# Patient Record
Sex: Female | Born: 1960 | Race: White | Hispanic: Yes | Marital: Married | Smoking: Never smoker
Health system: Southern US, Community
[De-identification: ages and names within clinical notes are randomized; demographics above are authoritative.]

## PROBLEM LIST (undated history)

## (undated) DIAGNOSIS — K760 Fatty (change of) liver, not elsewhere classified: Secondary | ICD-10-CM

## (undated) DIAGNOSIS — I862 Pelvic varices: Secondary | ICD-10-CM

## (undated) DIAGNOSIS — J40 Bronchitis, not specified as acute or chronic: Secondary | ICD-10-CM

## (undated) DIAGNOSIS — J45909 Unspecified asthma, uncomplicated: Secondary | ICD-10-CM

## (undated) DIAGNOSIS — T7840XA Allergy, unspecified, initial encounter: Secondary | ICD-10-CM

## (undated) HISTORY — DX: Pelvic varices: I86.2

## (undated) HISTORY — DX: Bronchitis, not specified as acute or chronic: J40

## (undated) HISTORY — DX: Fatty (change of) liver, not elsewhere classified: K76.0

## (undated) HISTORY — DX: Allergy, unspecified, initial encounter: T78.40XA

---

## 2003-03-02 ENCOUNTER — Ambulatory Visit (HOSPITAL_COMMUNITY): Admission: RE | Admit: 2003-03-02 | Discharge: 2003-03-02 | Payer: Self-pay | Admitting: Internal Medicine

## 2003-03-02 ENCOUNTER — Encounter: Payer: Self-pay | Admitting: Internal Medicine

## 2005-01-12 ENCOUNTER — Ambulatory Visit: Payer: Self-pay | Admitting: *Deleted

## 2005-01-12 ENCOUNTER — Ambulatory Visit: Payer: Self-pay | Admitting: Family Medicine

## 2005-05-11 ENCOUNTER — Ambulatory Visit: Payer: Self-pay | Admitting: Family Medicine

## 2005-07-25 ENCOUNTER — Ambulatory Visit: Payer: Self-pay | Admitting: Internal Medicine

## 2005-08-02 ENCOUNTER — Ambulatory Visit (HOSPITAL_COMMUNITY): Admission: RE | Admit: 2005-08-02 | Discharge: 2005-08-02 | Payer: Self-pay | Admitting: Family Medicine

## 2005-08-08 ENCOUNTER — Ambulatory Visit: Payer: Self-pay | Admitting: Nurse Practitioner

## 2005-08-15 ENCOUNTER — Encounter: Admission: RE | Admit: 2005-08-15 | Discharge: 2005-08-15 | Payer: Self-pay | Admitting: Internal Medicine

## 2005-08-16 ENCOUNTER — Ambulatory Visit: Payer: Self-pay | Admitting: Family Medicine

## 2005-08-23 ENCOUNTER — Ambulatory Visit: Payer: Self-pay | Admitting: Family Medicine

## 2005-09-21 ENCOUNTER — Ambulatory Visit: Payer: Self-pay | Admitting: Family Medicine

## 2005-09-25 ENCOUNTER — Ambulatory Visit (HOSPITAL_COMMUNITY): Admission: RE | Admit: 2005-09-25 | Discharge: 2005-09-25 | Payer: Self-pay | Admitting: Internal Medicine

## 2005-09-25 ENCOUNTER — Ambulatory Visit: Payer: Self-pay | Admitting: Family Medicine

## 2006-05-31 ENCOUNTER — Ambulatory Visit: Payer: Self-pay | Admitting: Family Medicine

## 2006-11-21 ENCOUNTER — Ambulatory Visit: Payer: Self-pay | Admitting: Family Medicine

## 2007-03-27 ENCOUNTER — Ambulatory Visit: Payer: Self-pay | Admitting: Family Medicine

## 2007-04-22 ENCOUNTER — Ambulatory Visit: Payer: Self-pay | Admitting: Family Medicine

## 2007-05-28 ENCOUNTER — Ambulatory Visit: Payer: Self-pay | Admitting: Family Medicine

## 2007-08-28 ENCOUNTER — Encounter (INDEPENDENT_AMBULATORY_CARE_PROVIDER_SITE_OTHER): Payer: Self-pay | Admitting: *Deleted

## 2007-10-08 ENCOUNTER — Ambulatory Visit (HOSPITAL_COMMUNITY): Admission: RE | Admit: 2007-10-08 | Discharge: 2007-10-08 | Payer: Self-pay | Admitting: Family Medicine

## 2007-10-08 ENCOUNTER — Ambulatory Visit: Payer: Self-pay | Admitting: Internal Medicine

## 2008-01-10 ENCOUNTER — Ambulatory Visit: Payer: Self-pay | Admitting: Internal Medicine

## 2008-01-10 ENCOUNTER — Encounter (INDEPENDENT_AMBULATORY_CARE_PROVIDER_SITE_OTHER): Payer: Self-pay | Admitting: Family Medicine

## 2008-01-10 LAB — CONVERTED CEMR LAB
ALT: 57 units/L — ABNORMAL HIGH (ref 0–35)
AST: 37 units/L (ref 0–37)
Albumin: 4.4 g/dL (ref 3.5–5.2)
Alkaline Phosphatase: 73 units/L (ref 39–117)
BUN: 13 mg/dL (ref 6–23)
Basophils Absolute: 0 10*3/uL (ref 0.0–0.1)
Basophils Relative: 0 % (ref 0–1)
CO2: 22 meq/L (ref 19–32)
Calcium: 9.7 mg/dL (ref 8.4–10.5)
Chloride: 105 meq/L (ref 96–112)
Creatinine, Ser: 0.74 mg/dL (ref 0.40–1.20)
Eosinophils Absolute: 0.2 10*3/uL (ref 0.0–0.7)
Eosinophils Relative: 3 % (ref 0–5)
Glucose, Bld: 92 mg/dL (ref 70–99)
HCT: 42.2 % (ref 36.0–46.0)
Helicobacter Pylori Antibody-IgG: 6.1 — ABNORMAL HIGH
Hemoglobin: 13.7 g/dL (ref 12.0–15.0)
Lymphocytes Relative: 32 % (ref 12–46)
Lymphs Abs: 1.8 10*3/uL (ref 0.7–4.0)
MCHC: 32.5 g/dL (ref 30.0–36.0)
MCV: 90.4 fL (ref 78.0–100.0)
Monocytes Absolute: 0.7 10*3/uL (ref 0.1–1.0)
Monocytes Relative: 12 % (ref 3–12)
Neutro Abs: 3 10*3/uL (ref 1.7–7.7)
Neutrophils Relative %: 53 % (ref 43–77)
Platelets: 178 10*3/uL (ref 150–400)
Potassium: 4.3 meq/L (ref 3.5–5.3)
RBC: 4.67 M/uL (ref 3.87–5.11)
RDW: 13.2 % (ref 11.5–15.5)
Sodium: 138 meq/L (ref 135–145)
TSH: 2.623 microintl units/mL (ref 0.350–5.50)
Total Bilirubin: 0.6 mg/dL (ref 0.3–1.2)
Total Protein: 8.4 g/dL — ABNORMAL HIGH (ref 6.0–8.3)
WBC: 5.6 10*3/uL (ref 4.0–10.5)

## 2008-03-05 ENCOUNTER — Ambulatory Visit: Payer: Self-pay | Admitting: Internal Medicine

## 2008-08-11 ENCOUNTER — Ambulatory Visit: Payer: Self-pay | Admitting: Internal Medicine

## 2008-12-14 ENCOUNTER — Ambulatory Visit: Payer: Self-pay | Admitting: Internal Medicine

## 2009-07-22 ENCOUNTER — Ambulatory Visit: Payer: Self-pay | Admitting: Family Medicine

## 2009-07-22 ENCOUNTER — Encounter (INDEPENDENT_AMBULATORY_CARE_PROVIDER_SITE_OTHER): Payer: Self-pay | Admitting: Adult Health

## 2009-07-22 LAB — CONVERTED CEMR LAB
ALT: 58 units/L — ABNORMAL HIGH (ref 0–35)
AST: 43 units/L — ABNORMAL HIGH (ref 0–37)
Albumin: 4.2 g/dL (ref 3.5–5.2)
Alkaline Phosphatase: 70 units/L (ref 39–117)
BUN: 15 mg/dL (ref 6–23)
Basophils Absolute: 0 10*3/uL (ref 0.0–0.1)
Basophils Relative: 0 % (ref 0–1)
CO2: 24 meq/L (ref 19–32)
Calcium: 9.3 mg/dL (ref 8.4–10.5)
Chloride: 106 meq/L (ref 96–112)
Creatinine, Ser: 0.72 mg/dL (ref 0.40–1.20)
Eosinophils Absolute: 0.3 10*3/uL (ref 0.0–0.7)
Eosinophils Relative: 5 % (ref 0–5)
Glucose, Bld: 79 mg/dL (ref 70–99)
HCT: 37.5 % (ref 36.0–46.0)
Helicobacter Pylori Antibody-IgG: 6.4 — ABNORMAL HIGH
Hemoglobin: 12.3 g/dL (ref 12.0–15.0)
Lymphocytes Relative: 35 % (ref 12–46)
Lymphs Abs: 1.8 10*3/uL (ref 0.7–4.0)
MCHC: 32.8 g/dL (ref 30.0–36.0)
MCV: 88.9 fL (ref 78.0–100.0)
Monocytes Absolute: 0.7 10*3/uL (ref 0.1–1.0)
Monocytes Relative: 13 % — ABNORMAL HIGH (ref 3–12)
Neutro Abs: 2.5 10*3/uL (ref 1.7–7.7)
Neutrophils Relative %: 47 % (ref 43–77)
Platelets: 161 10*3/uL (ref 150–400)
Potassium: 4.1 meq/L (ref 3.5–5.3)
RBC: 4.22 M/uL (ref 3.87–5.11)
RDW: 13 % (ref 11.5–15.5)
Sodium: 138 meq/L (ref 135–145)
TSH: 1.942 microintl units/mL (ref 0.350–4.500)
Total Bilirubin: 0.4 mg/dL (ref 0.3–1.2)
Total Protein: 7.9 g/dL (ref 6.0–8.3)
WBC: 5.3 10*3/uL (ref 4.0–10.5)

## 2009-07-26 ENCOUNTER — Encounter (INDEPENDENT_AMBULATORY_CARE_PROVIDER_SITE_OTHER): Payer: Self-pay | Admitting: Adult Health

## 2009-07-26 LAB — CONVERTED CEMR LAB
HCV Ab: NEGATIVE
Hep A IgM: NEGATIVE
Hep B C IgM: NEGATIVE
Hepatitis B Surface Ag: NEGATIVE

## 2009-07-27 ENCOUNTER — Ambulatory Visit (HOSPITAL_COMMUNITY): Admission: RE | Admit: 2009-07-27 | Discharge: 2009-07-27 | Payer: Self-pay | Admitting: Internal Medicine

## 2009-07-28 ENCOUNTER — Ambulatory Visit: Payer: Self-pay | Admitting: Internal Medicine

## 2009-10-05 ENCOUNTER — Encounter (INDEPENDENT_AMBULATORY_CARE_PROVIDER_SITE_OTHER): Payer: Self-pay | Admitting: Adult Health

## 2009-10-05 ENCOUNTER — Ambulatory Visit: Payer: Self-pay | Admitting: Internal Medicine

## 2009-10-05 LAB — CONVERTED CEMR LAB
ALT: 39 units/L — ABNORMAL HIGH (ref 0–35)
AST: 30 units/L (ref 0–37)
Albumin: 4.3 g/dL (ref 3.5–5.2)
Alkaline Phosphatase: 66 units/L (ref 39–117)
BUN: 16 mg/dL (ref 6–23)
CO2: 21 meq/L (ref 19–32)
Calcium: 8.9 mg/dL (ref 8.4–10.5)
Chlamydia, DNA Probe: NEGATIVE
Chloride: 106 meq/L (ref 96–112)
Creatinine, Ser: 0.6 mg/dL (ref 0.40–1.20)
GC Probe Amp, Genital: NEGATIVE
Glucose, Bld: 109 mg/dL — ABNORMAL HIGH (ref 70–99)
Potassium: 3.5 meq/L (ref 3.5–5.3)
Sodium: 137 meq/L (ref 135–145)
Total Bilirubin: 0.5 mg/dL (ref 0.3–1.2)
Total Protein: 8 g/dL (ref 6.0–8.3)
Vit D, 25-Hydroxy: 17 ng/mL — ABNORMAL LOW (ref 30–89)

## 2009-10-26 ENCOUNTER — Ambulatory Visit: Payer: Self-pay | Admitting: Internal Medicine

## 2009-11-08 ENCOUNTER — Ambulatory Visit: Payer: Self-pay | Admitting: Internal Medicine

## 2011-05-17 ENCOUNTER — Emergency Department (HOSPITAL_COMMUNITY)
Admission: EM | Admit: 2011-05-17 | Discharge: 2011-05-17 | Disposition: A | Discharge status: Home / Self Care | Payer: Worker's Compensation | Attending: Emergency Medicine | Admitting: Emergency Medicine

## 2011-05-17 ENCOUNTER — Emergency Department (HOSPITAL_COMMUNITY): Payer: Worker's Compensation

## 2011-05-17 DIAGNOSIS — Z23 Encounter for immunization: Secondary | ICD-10-CM | POA: Insufficient documentation

## 2011-05-17 DIAGNOSIS — W2203XA Walked into furniture, initial encounter: Secondary | ICD-10-CM | POA: Insufficient documentation

## 2011-05-17 DIAGNOSIS — S0180XA Unspecified open wound of other part of head, initial encounter: Secondary | ICD-10-CM | POA: Insufficient documentation

## 2011-05-17 DIAGNOSIS — Y990 Civilian activity done for income or pay: Secondary | ICD-10-CM | POA: Insufficient documentation

## 2011-05-24 ENCOUNTER — Emergency Department (HOSPITAL_COMMUNITY)
Admission: EM | Admit: 2011-05-24 | Discharge: 2011-05-24 | Disposition: A | Discharge status: Home / Self Care | Payer: Worker's Compensation | Attending: Emergency Medicine | Admitting: Emergency Medicine

## 2011-05-24 DIAGNOSIS — Z4802 Encounter for removal of sutures: Secondary | ICD-10-CM | POA: Insufficient documentation

## 2013-12-02 ENCOUNTER — Ambulatory Visit: Payer: Self-pay

## 2013-12-06 ENCOUNTER — Encounter (HOSPITAL_COMMUNITY): Payer: Self-pay | Admitting: Emergency Medicine

## 2013-12-06 ENCOUNTER — Emergency Department (INDEPENDENT_AMBULATORY_CARE_PROVIDER_SITE_OTHER): Payer: Worker's Compensation

## 2013-12-06 ENCOUNTER — Emergency Department (INDEPENDENT_AMBULATORY_CARE_PROVIDER_SITE_OTHER)
Admission: EM | Admit: 2013-12-06 | Discharge: 2013-12-06 | Disposition: A | Discharge status: Home / Self Care | Payer: Worker's Compensation | Source: Home / Self Care

## 2013-12-06 DIAGNOSIS — R0982 Postnasal drip: Secondary | ICD-10-CM

## 2013-12-06 DIAGNOSIS — R05 Cough: Secondary | ICD-10-CM

## 2013-12-06 LAB — POCT RAPID STREP A: Streptococcus, Group A Screen (Direct): NEGATIVE

## 2013-12-06 MED ORDER — ALBUTEROL SULFATE HFA 108 (90 BASE) MCG/ACT IN AERS: 2.0000 | INHALATION_SPRAY | Freq: Four times a day (QID) | RESPIRATORY_TRACT | Status: DC | PRN

## 2013-12-06 MED ORDER — HYDROCOD POLST-CHLORPHEN POLST 10-8 MG/5ML PO LQCR: ORAL | Status: DC

## 2013-12-06 NOTE — ED Provider Notes (Signed)
Medical screening examination/treatment/procedure(s) were performed by non-physician practitioner and as supervising physician I was immediately available for consultation/collaboration.  Caliope Ruppert, M.D.  Lakethia Coppess C Evony Rezek, MD 12/06/13 2310 

## 2013-12-06 NOTE — ED Provider Notes (Signed)
CSN: [id number]     Arrival date & time 12/06/13  1609 History   First MD Initiated Contact with Patient 12/06/13 1824     Chief Complaint  Patient presents with  . Cough   (Consider location/radiation/quality/duration/timing/severity/associated sxs/prior Treatment) HPI Comments: 52 year old Hispanic female presents with a cough for one month. Symptoms started out as upper respiratory congestion and fever. Some of the symptoms improved however she has a continuous dry tacky cough. This is associated with chest soreness and pain upon coughing. The greatest is in the bilateral ribs.   History reviewed. No pertinent past medical history. History reviewed. No pertinent past surgical history. History reviewed. No pertinent family history. History  Substance Use Topics  . Smoking status: Never Smoker   . Smokeless tobacco: Not on file  . Alcohol Use: No   OB History   Grav Para Term Preterm Abortions TAB SAB Ect Mult Living                 Review of Systems  Constitutional: Positive for fever and activity change. Negative for chills, appetite change and fatigue.  HENT: Positive for congestion, postnasal drip, rhinorrhea and sore throat. Negative for ear pain and facial swelling.   Eyes: Negative.   Respiratory: Positive for cough. Negative for chest tightness and wheezing.   Cardiovascular: Negative.  Negative for leg swelling.  Gastrointestinal: Negative.   Musculoskeletal: Negative for neck pain and neck stiffness.  Skin: Negative for pallor and rash.  Neurological: Negative.     Allergies  Review of patient's allergies indicates no known allergies.  Home Medications   Current Outpatient Rx  Name  Route  Sig  Dispense  Refill  . albuterol (PROVENTIL HFA;VENTOLIN HFA) 108 (90 BASE) MCG/ACT inhaler   Inhalation   Inhale 2 puffs into the lungs every 6 (six) hours as needed for wheezing or shortness of breath.   1 Inhaler   0   . chlorpheniramine-HYDROcodone (TUSSIONEX  PENNKINETIC ER) 10-8 MG/5ML LQCR      Take 2.5 ml to 5 ml every 12 hours prn cough. Will cause drowsiness.   60 mL   0    BP 125/86  Pulse 70  Temp(Src) 98.3 F (36.8 C) (Oral)  Resp 17  SpO2 98% Physical Exam  Nursing note and vitals reviewed. Constitutional: She is oriented to person, place, and time. She appears well-developed and well-nourished. No distress.  HENT:  Bilateral TMs are normal Oropharynx with bilaterally enlarged, cryptic palatine tonsils. Oropharynx is erythematous but without exudates. Positive for clear PND.  Eyes: Conjunctivae and EOM are normal.  Neck: Normal range of motion. Neck supple.  Cardiovascular: Normal rate, regular rhythm and normal heart sounds.   Pulmonary/Chest: Effort normal and breath sounds normal. No respiratory distress. She has no wheezes. She has no rales. She exhibits tenderness.  Musculoskeletal: Normal range of motion. She exhibits no edema.  Lymphadenopathy:    She has no cervical adenopathy.  Neurological: She is alert and oriented to person, place, and time.  Skin: Skin is warm and dry. No rash noted.  Psychiatric: She has a normal mood and affect.    ED Course  Procedures (including critical care time) Labs Review Labs Reviewed  POCT RAPID STREP A ([facility] CARE ONLY)   Imaging Review Dg Chest 2 View  12/06/2013   CLINICAL DATA:  Cough and congestion for 1 month.  EXAM: CHEST  2 VIEW  COMPARISON:  None.  FINDINGS: Cardiac silhouette is normal in size. The aorta is  mildly uncoiled. No mediastinal or hilar masses. Clear lungs. No pleural effusion or pneumothorax. The bony thorax is intact.  IMPRESSION: No active cardiopulmonary disease.   Electronically Signed   By: Amie Portland M.D.   On: 12/06/2013 19:25      MDM   1. Cough   2. PND (post-nasal drip)      The chest x-ray reveals no etiology for the cough. I suspect the cough is due to occult bronchospasm with contribution from PND. Tussionex 1/2-1 teaspoon every 12  hours when necessary cough and drainage Albuterol HFA 2 puffs q. 4-6 hours when necessary cough Followup with PCP as soon as possible recommend that she start locating one as soon as possible.  Hayden Rasmussen, NP 12/06/13 2021

## 2013-12-06 NOTE — ED Notes (Signed)
Pt  Has  A  Non  Productive   Cough      X   1  Month   -  Pt  Reports  Pain  In  Sides  And  Shoulders  As  Well         She  Reports  A  headachhe  As  Well  -  The  Pt  Ambulated  To  Room    Her  Skin is  Warm  And  Dry        Family  There  To interpret

## 2013-12-09 LAB — CULTURE, GROUP A STREP

## 2013-12-16 ENCOUNTER — Telehealth: Payer: Self-pay

## 2013-12-16 ENCOUNTER — Encounter: Payer: Self-pay | Admitting: Internal Medicine

## 2013-12-16 ENCOUNTER — Ambulatory Visit: Payer: Worker's Compensation | Attending: Internal Medicine | Admitting: Internal Medicine

## 2013-12-16 VITALS — BP 129/87 | HR 71 | Temp 97.9°F | Resp 16 | Ht 64.0 in | Wt 184.0 lb

## 2013-12-16 DIAGNOSIS — R059 Cough, unspecified: Secondary | ICD-10-CM

## 2013-12-16 DIAGNOSIS — R05 Cough: Secondary | ICD-10-CM | POA: Insufficient documentation

## 2013-12-16 DIAGNOSIS — Z139 Encounter for screening, unspecified: Secondary | ICD-10-CM

## 2013-12-16 DIAGNOSIS — J329 Chronic sinusitis, unspecified: Secondary | ICD-10-CM

## 2013-12-16 DIAGNOSIS — R0981 Nasal congestion: Secondary | ICD-10-CM

## 2013-12-16 DIAGNOSIS — J3489 Other specified disorders of nose and nasal sinuses: Secondary | ICD-10-CM

## 2013-12-16 LAB — COMPLETE METABOLIC PANEL WITH GFR
ALT: 33 U/L (ref 0–35)
AST: 29 U/L (ref 0–37)
Albumin: 4.3 g/dL (ref 3.5–5.2)
Alkaline Phosphatase: 86 U/L (ref 39–117)
BUN: 12 mg/dL (ref 6–23)
CO2: 28 mEq/L (ref 19–32)
Calcium: 9.4 mg/dL (ref 8.4–10.5)
Chloride: 102 mEq/L (ref 96–112)
Creat: 0.6 mg/dL (ref 0.50–1.10)
GFR, Est African American: >89 mL/min
GFR, Est Non African American: >89 mL/min
Glucose, Bld: 85 mg/dL (ref 70–99)
Potassium: 4 mEq/L (ref 3.5–5.3)
Sodium: 140 mEq/L (ref 135–145)
Total Bilirubin: 0.3 mg/dL (ref 0.3–1.2)
Total Protein: 8.2 g/dL (ref 6.0–8.3)

## 2013-12-16 LAB — LIPID PANEL
Cholesterol: 156 mg/dL (ref 0–200)
HDL: 37 mg/dL — ABNORMAL LOW (ref >39)
LDL Cholesterol: 88 mg/dL (ref 0–99)
Total CHOL/HDL Ratio: 4.2 Ratio
Triglycerides: 156 mg/dL — ABNORMAL HIGH (ref <150)
VLDL: 31 mg/dL (ref 0–40)

## 2013-12-16 LAB — CBC WITH DIFFERENTIAL/PLATELET
Basophils Absolute: 0 10*3/uL (ref 0.0–0.1)
Basophils Relative: 1 % (ref 0–1)
Eosinophils Absolute: 0.3 10*3/uL (ref 0.0–0.7)
Eosinophils Relative: 6 % — ABNORMAL HIGH (ref 0–5)
HCT: 39.1 % (ref 36.0–46.0)
Hemoglobin: 13.5 g/dL (ref 12.0–15.0)
Lymphocytes Relative: 36 % (ref 12–46)
Lymphs Abs: 1.8 10*3/uL (ref 0.7–4.0)
MCH: 30.8 pg (ref 26.0–34.0)
MCHC: 34.5 g/dL (ref 30.0–36.0)
MCV: 89.3 fL (ref 78.0–100.0)
Monocytes Absolute: 0.5 10*3/uL (ref 0.1–1.0)
Monocytes Relative: 11 % (ref 3–12)
Neutro Abs: 2.4 10*3/uL (ref 1.7–7.7)
Neutrophils Relative %: 46 % (ref 43–77)
Platelets: 194 10*3/uL (ref 150–400)
RBC: 4.38 MIL/uL (ref 3.87–5.11)
RDW: 13.8 % (ref 11.5–15.5)
WBC: 5.1 10*3/uL (ref 4.0–10.5)

## 2013-12-16 MED ORDER — SULFAMETHOXAZOLE-TMP DS 800-160 MG PO TABS: 1.0000 | ORAL_TABLET | Freq: Two times a day (BID) | ORAL | Status: DC

## 2013-12-16 MED ORDER — FLUTICASONE PROPIONATE 50 MCG/ACT NA SUSP: 2.0000 | Freq: Every day | NASAL | Status: DC

## 2013-12-16 MED ORDER — BENZONATATE 100 MG PO CAPS: 100.0000 mg | ORAL_CAPSULE | Freq: Three times a day (TID) | ORAL | Status: DC | PRN

## 2013-12-16 NOTE — Progress Notes (Signed)
Patient Demographics  Melanie Durham, is a 53 y.o. female  HXT:[id number]  [id number]:[id number]  DOB - Apr 26, 1961  CC:  Chief Complaint  Patient presents with  . Establish Care  . Bronchitis       HPI: Melanie Durham is a 53 y.o. female here today to establish medical care. She was recently seen in urgent care with symptoms of URI ongoing for the last 3-4 weeks, EMR reviewed had a chest x-ray done which was negative, patient was prescribed albuterol and cough medication, as per patient symptoms are not improving she still has nasal congestion postnasal drip minimal sore throat productive cough, she does not smoke cigarettes Patient has No headache, No chest pain, No abdominal pain - No Nausea, No new weakness tingling or numbness, Allergies  Allergen Reactions  . Penicillins    Past Medical History  Diagnosis Date  . Bronchitis    Current Outpatient Prescriptions on File Prior to Visit  Medication Sig Dispense Refill  . chlorpheniramine-HYDROcodone (TUSSIONEX PENNKINETIC ER) 10-8 MG/5ML LQCR Take 2.5 ml to 5 ml every 12 hours prn cough. Will cause drowsiness.  60 mL  0  . albuterol (PROVENTIL HFA;VENTOLIN HFA) 108 (90 BASE) MCG/ACT inhaler Inhale 2 puffs into the lungs every 6 (six) hours as needed for wheezing or shortness of breath.  1 Inhaler  0   No current facility-administered medications on file prior to visit.   Family History  Problem Relation Age of Onset  . Diabetes Father   . Heart disease Maternal Grandmother    History   Social History  . Marital Status: Married    Spouse Name: N/A    Number of Children: N/A  . Years of Education: N/A   Occupational History  . Not on file.   Social History Main Topics  . Smoking status: Never Smoker   . Smokeless tobacco: Not on file  . Alcohol Use: No  . Drug Use: No  . Sexual Activity: Not on file   Other Topics Concern  . Not on file   Social History Narrative  . No narrative on file    Review of  Systems: Constitutional: Negative for fever, chills, diaphoresis, activity change, appetite change and fatigue. HENT: Negative for ear pain, nosebleeds, congestion, facial swelling, rhinorrhea, neck pain, neck stiffness and ear discharge.  Eyes: Negative for pain, discharge, redness, itching and visual disturbance. Respiratory: Positive for cough,  shortness of breath, negative wheezing and stridor.  Cardiovascular: Negative for chest pain, palpitations and leg swelling. Gastrointestinal: Negative for abdominal distention. Genitourinary: Negative for dysuria, urgency, frequency, hematuria, flank pain, decreased urine volume, difficulty urinating and dyspareunia.  Musculoskeletal: Negative for back pain, joint swelling, arthralgia and gait problem. Neurological: Negative for dizziness, tremors, seizures, syncope, facial asymmetry, speech difficulty, weakness, light-headedness, numbness and headaches.  Hematological: Negative for adenopathy. Does not bruise/bleed easily. Psychiatric/Behavioral: Negative for hallucinations, behavioral problems, confusion, dysphoric mood, decreased concentration and agitation.    Objective:   Filed Vitals:   12/16/13 1533  BP: 129/87  Pulse: 71  Temp: 97.9 F (36.6 C)  Resp: 16    Physical Exam: Constitutional: Patient appears well-developed and has intermittent cough HENT: Sinus tenderness nasal congestion Oropharynx is clear and moist. Enlarged tonsils no exudate Eyes: Conjunctivae and EOM are normal. PERRLA, no scleral icterus. Neck: Normal ROM. Neck supple. No JVD. Positive cervical lymphadenopathy CVS: RRR, S1/S2 +, no murmurs, no gallops, no carotid bruit.  Pulmonary: Effort and breath sounds normal, no stridor, rhonchi, wheezes, rales.  Abdominal: Soft. BS +,  no distension, tenderness, rebound or guarding.  Musculoskeletal: Normal range of motion. No edema and no tenderness.  Neuro: Alert. Normal reflexes, muscle tone coordination. No cranial  nerve deficit. Skin: Skin is warm and dry. No rash noted. Not diaphoretic. No erythema. No pallor. Psychiatric: Normal mood and affect. Behavior, judgment, thought content normal.  Lab Results  Component Value Date   WBC 5.3 07/22/2009   HGB 12.3 07/22/2009   HCT 37.5 07/22/2009   MCV 88.9 07/22/2009   PLT 161 07/22/2009   Lab Results  Component Value Date   CREATININE 0.60 10/05/2009   BUN 16 10/05/2009   NA 137 10/05/2009   K 3.5 10/05/2009   CL 106 10/05/2009   CO2 21 10/05/2009    No results found for this basename: HGBA1C   Lipid Panel  No results found for this basename: chol, trig, hdl, cholhdl, vldl, ldlcalc       Assessment and plan:   1. Sinusitis  - sulfamethoxazole-trimethoprim (BACTRIM DS) 800-160 MG per tablet; Take 1 tablet by mouth 2 (two) times daily.  Dispense: 20 tablet; Refill: 0  2. Stuffy nose  - fluticasone (FLONASE) 50 MCG/ACT nasal spray; Place 2 sprays into both nostrils daily.  Dispense: 16 g; Refill: 1  3. Cough  - benzonatate (TESSALON) 100 MG capsule; Take 1 capsule (100 mg total) by mouth 3 (three) times daily as needed for cough.  Dispense: 30 capsule; Refill: 1  4. Screening Do baseline blood work - CBC with Differential - COMPLETE METABOLIC PANEL WITH GFR - TSH - Lipid panel - Vit D  25 hydroxy (rtn osteoporosis monitoring)  Advised patient for saltwater gargles  Return in about 6 weeks (around 01/27/2014), or if symptoms worsen or fail to improve.    The patient was given clear instructions to go to ER or return to medical center if symptoms don't improve, worsen or new problems develop. The patient verbalized understanding. The patient was told to call to get lab results if they haven't heard anything in the next week.     Lorayne Marek, MD

## 2013-12-16 NOTE — Progress Notes (Signed)
Pt here to establish care C/o frequent cough with yellow phlegm s/p flu virus Taking prescribed Cetirizine and Hydrocodone/Chlorphen ER with some relief Hx Bronchitis-nonsmoker Afebrile. Denies n/v Spanish interpretor present

## 2013-12-17 ENCOUNTER — Ambulatory Visit: Payer: Worker's Compensation | Attending: Internal Medicine

## 2013-12-17 ENCOUNTER — Telehealth: Payer: Self-pay

## 2013-12-17 LAB — VITAMIN D 25 HYDROXY (VIT D DEFICIENCY, FRACTURES): Vit D, 25-Hydroxy: 21 ng/mL — ABNORMAL LOW (ref 30–89)

## 2013-12-17 LAB — TSH: TSH: 2.783 u[IU]/mL (ref 0.350–4.500)

## 2013-12-17 MED ORDER — VITAMIN D (ERGOCALCIFEROL) 1.25 MG (50000 UNIT) PO CAPS: 50000.0000 [IU] | ORAL_CAPSULE | ORAL | Status: DC

## 2013-12-17 NOTE — Telephone Encounter (Signed)
Message copied by Dorothe Pea on Wed Dec 17, 2013 11:23 AM ------      Message from: Lorayne Marek      Created: Wed Dec 17, 2013 10:12 AM       Blood work reviewed, noticed low vitamin D, call patient advise to start ergocalciferol 50,000 units once a week for the duration of  12 weeks.       ------

## 2013-12-17 NOTE — Telephone Encounter (Signed)
Interpreter line used Patient is aware of her labs Prescription sent to our pharmacy

## 2014-01-27 ENCOUNTER — Ambulatory Visit: Payer: Self-pay

## 2014-02-09 ENCOUNTER — Telehealth: Payer: Self-pay | Admitting: Internal Medicine

## 2014-02-09 NOTE — Telephone Encounter (Signed)
LVM to reschedule appt

## 2014-03-04 ENCOUNTER — Encounter: Payer: Self-pay | Admitting: Internal Medicine

## 2014-04-14 ENCOUNTER — Telehealth: Payer: Self-pay | Admitting: Emergency Medicine

## 2014-04-14 ENCOUNTER — Ambulatory Visit: Payer: No Typology Code available for payment source | Attending: Internal Medicine | Admitting: Internal Medicine

## 2014-04-14 ENCOUNTER — Encounter: Payer: Self-pay | Admitting: Internal Medicine

## 2014-04-14 VITALS — BP 118/79 | HR 66 | Temp 98.6°F | Resp 16 | Ht 66.0 in | Wt 183.0 lb

## 2014-04-14 DIAGNOSIS — B351 Tinea unguium: Secondary | ICD-10-CM

## 2014-04-14 LAB — COMPREHENSIVE METABOLIC PANEL
ALT: 37 U/L — ABNORMAL HIGH (ref 0–35)
AST: 26 U/L (ref 0–37)
Albumin: 4.3 g/dL (ref 3.5–5.2)
Alkaline Phosphatase: 82 U/L (ref 39–117)
BUN: 11 mg/dL (ref 6–23)
CO2: 28 mEq/L (ref 19–32)
Calcium: 9.7 mg/dL (ref 8.4–10.5)
Chloride: 102 mEq/L (ref 96–112)
Creat: 0.61 mg/dL (ref 0.50–1.10)
Glucose, Bld: 92 mg/dL (ref 70–99)
Potassium: 4.2 mEq/L (ref 3.5–5.3)
Sodium: 136 mEq/L (ref 135–145)
Total Bilirubin: 0.6 mg/dL (ref 0.2–1.2)
Total Protein: 8.3 g/dL (ref 6.0–8.3)

## 2014-04-14 MED ORDER — TERBINAFINE HCL 250 MG PO TABS: 250.0000 mg | ORAL_TABLET | Freq: Every day | ORAL | Status: DC

## 2014-04-14 NOTE — Telephone Encounter (Signed)
Attempted to reach pt regarding medication clarification. Left message per language line to not start Lamisil medication until lab results are back in 2 dys

## 2014-04-14 NOTE — Progress Notes (Unsigned)
Pt here with c/o itchiness around mole of right upper buttock x 1 month. Pt denies redness or swelling or pain.  Spanish interpretor present for communication

## 2014-04-14 NOTE — Progress Notes (Unsigned)
Patient ID: Melanie Durham, female   DOB: 01/24/1961, 53 y.o.   MRN: [id number]   HPI: Melanie Durham is a 53 y.o. female presenting on 04/14/2014 who comes in for evaluation for an itchy mole on her left buttock and for treatment of toe and fingernail fungus.     Past Medical History  Diagnosis Date  . Bronchitis     History reviewed. No pertinent past surgical history.  Current Outpatient Prescriptions  Medication Sig Dispense Refill  . albuterol (PROVENTIL HFA;VENTOLIN HFA) 108 (90 BASE) MCG/ACT inhaler Inhale 2 puffs into the lungs every 6 (six) hours as needed for wheezing or shortness of breath.  1 Inhaler  0  . benzonatate (TESSALON) 100 MG capsule Take 1 capsule (100 mg total) by mouth 3 (three) times daily as needed for cough.  30 capsule  1  . chlorpheniramine-HYDROcodone (TUSSIONEX PENNKINETIC ER) 10-8 MG/5ML LQCR Take 2.5 ml to 5 ml every 12 hours prn cough. Will cause drowsiness.  60 mL  0  . fluticasone (FLONASE) 50 MCG/ACT nasal spray Place 2 sprays into both nostrils daily.  16 g  1  . terbinafine (LAMISIL) 250 MG tablet Take 1 tablet (250 mg total) by mouth daily.  30 tablet  3  . Vitamin D, Ergocalciferol, (DRISDOL) 50000 UNITS CAPS capsule Take 1 capsule (50,000 Units total) by mouth every 7 (seven) days.  12 capsule  0   No current facility-administered medications for this visit.    Allergies  Allergen Reactions  . Penicillins     Family History  Problem Relation Age of Onset  . Diabetes Father   . Heart disease Maternal Grandmother     History   Social History  . Marital Status: Married    Spouse Name: N/A    Number of Children: N/A  . Years of Education: N/A   Occupational History  . Not on file.   Social History Main Topics  . Smoking status: Never Smoker   . Smokeless tobacco: Not on file  . Alcohol Use: No  . Drug Use: No  . Sexual Activity: Not on file   Other Topics Concern  . Not on file   Social History Narrative  . No  narrative on file    Review of Systems  Review of Systems  Constitutional: Negative for fever, chills, diaphoresis, activity change, appetite change and fatigue.  HENT: Negative for ear pain, nosebleeds, congestion, facial swelling, rhinorrhea, neck pain, neck stiffness and ear discharge.  Eyes: Negative for pain, discharge, redness, itching and visual disturbance.  Respiratory: Negative for cough, choking, chest tightness, shortness of breath, wheezing and stridor.  Cardiovascular: Negative for chest pain, palpitations and leg swelling.  Gastrointestinal: Negative for abdominal distention, vomiting, diarrhea or consitpation Genitourinary: Negative for dysuria, urgency, frequency, hematuria, flank pain, decreased urine volume, difficulty urinating and dyspareunia.  Musculoskeletal: Negative for back pain, joint swelling, arthralgias or gait problem.  Neurological: Negative for dizziness, tremors, seizures, syncope, facial asymmetry, speech difficulty, weakness, light-headedness, numbness and headaches.  Hematological: Negative for adenopathy. Does not bruise/bleed easily.  Psychiatric/Behavioral: Negative for hallucinations, behavioral problems, confusion, dysphoric mood    Objective:  BP 118/79  Pulse 66  Temp(Src) 98.6 F (37 C) (Oral)  Resp 16  Ht 5\' 6"  (1.676 m)  Wt 183 lb (83.008 kg)  BMI 29.55 kg/m2  SpO2 97% Filed Weights   04/14/14 0955  Weight: 183 lb (83.008 kg)     Physical Exam  Constitutional: Appears well-developed and well-nourished. No distress. HENT: Normocephalic.  External right and left ear normal. Oropharynx is clear and moist.  Eyes: Conjunctivae and EOM are normal. PERRLA, no scleral icterus.  Neck: Normal ROM. Neck supple. No JVD. No tracheal deviation. No thyromegaly.  CVS: RRR, S1/S2 +, no murmurs, no gallops, no carotid bruit.  Pulmonary: Effort and breath sounds normal, no stridor, rhonchi, wheezes, rales.  Abdominal: Soft. BS +,  no distension,  tenderness, rebound or guarding.  Musculoskeletal: Normal range of motion. No edema and no tenderness.  Neuro: Alert. Normal reflexes, muscle tone coordination. No cranial nerve deficit. Nails: fungal infection more prominent on right foot 1st, 4th and 5th toes and on hands (thumbs) Skin: small black mole about 1 cm in diameter on left upper buttock with no signs of discoloration or inflammation  Psychiatric: Normal mood and affect. Behavior, judgment, thought content normal.   Lab Results  Component Value Date   WBC 5.1 12/16/2013   HGB 13.5 12/16/2013   HCT 39.1 12/16/2013   MCV 89.3 12/16/2013   PLT 194 12/16/2013   Lab Results  Component Value Date   CREATININE 0.60 12/16/2013   BUN 12 12/16/2013   NA 140 12/16/2013   K 4.0 12/16/2013   CL 102 12/16/2013   CO2 28 12/16/2013    No results found for this basename: HGBA1C   Lipid Panel     Component Value Date/Time   CHOL 156 12/16/2013 1550   TRIG 156* 12/16/2013 1550   HDL 37* 12/16/2013 1550   CHOLHDL 4.2 12/16/2013 1550   VLDL 31 12/16/2013 1550   LDLCALC 88 12/16/2013 1550        Patient Active Problem List   Diagnosis Date Noted  . Sinusitis 12/16/2013  . Stuffy nose 12/16/2013     Preventative Medicine:  Health Maintenance  Topic Date Due  . Pap Smear  01/31/1981  . Tetanus/tdap  01/31/1982  . Mammogram  01/31/2013  . Colonoscopy  01/31/2013  . Influenza Vaccine  07/11/2014    Adult vaccines due  Topic Date Due  . Tetanus/tdap  01/31/1982   Mammogram/Pap Smear : Colonoscopy : Flu vaccine:   LAB WORK:  Metabolic panel: CBC:  Vitamin D : Lipid Panel: TSH: PSA:    Assessment and plan: Nail fungal infection - Plan: Comprehensive metabolic panel - if LFTs normal, pt can fill and start Terbinafine for a 12 wk course - repeat LFTs on a monthly basis  Mole Referral to Derm for removal per patient's request.    Return in about 1 month (around 05/15/2014), or if symptoms worsen or fail to improve.   The patient was  given clear instructions to go to ER or return to medical center if symptoms don't improve, worsen or new problems develop. The patient verbalized understanding. The patient was told to call to get lab results if they haven't heard anything in the next week.     Debbe Odea, MD

## 2014-05-15 ENCOUNTER — Encounter: Payer: Self-pay | Admitting: Internal Medicine

## 2014-05-15 ENCOUNTER — Ambulatory Visit: Payer: No Typology Code available for payment source | Attending: Internal Medicine | Admitting: Internal Medicine

## 2014-05-15 VITALS — BP 130/81 | HR 78 | Temp 98.2°F | Resp 16 | Wt 185.8 lb

## 2014-05-15 DIAGNOSIS — L299 Pruritus, unspecified: Secondary | ICD-10-CM

## 2014-05-15 DIAGNOSIS — D235 Other benign neoplasm of skin of trunk: Secondary | ICD-10-CM | POA: Insufficient documentation

## 2014-05-15 DIAGNOSIS — D239 Other benign neoplasm of skin, unspecified: Secondary | ICD-10-CM

## 2014-05-15 DIAGNOSIS — D229 Melanocytic nevi, unspecified: Secondary | ICD-10-CM

## 2014-05-15 DIAGNOSIS — B351 Tinea unguium: Secondary | ICD-10-CM | POA: Insufficient documentation

## 2014-05-15 MED ORDER — CETIRIZINE HCL 10 MG PO TABS: 10.0000 mg | ORAL_TABLET | Freq: Every day | ORAL | Status: DC

## 2014-05-15 NOTE — Progress Notes (Signed)
MRN: [id number] Name: Melanie Durham  Sex: female Age: 53 y.o. DOB: June 27, 1961  Allergies: Penicillins  Chief Complaint  Patient presents with  . Nevus    HPI: Patient is 53 y.o. female who Comes today reported to have the mole on her left buttock area since birth but she reported to increase in size and possible change in color recently also patient complaining of some itching around the area denies any fever chills any discharge, she also has history of onychomycosis and her was started on Lamisil.  Past Medical History  Diagnosis Date  . Bronchitis     History reviewed. No pertinent past surgical history.    Medication List       This list is accurate as of: 05/15/14  5:16 PM.  Always use your most recent med list.               albuterol 108 (90 BASE) MCG/ACT inhaler  Commonly known as:  PROVENTIL HFA;VENTOLIN HFA  Inhale 2 puffs into the lungs every 6 (six) hours as needed for wheezing or shortness of breath.     benzonatate 100 MG capsule  Commonly known as:  TESSALON  Take 1 capsule (100 mg total) by mouth 3 (three) times daily as needed for cough.     cetirizine 10 MG tablet  Commonly known as:  ZYRTEC  Take 1 tablet (10 mg total) by mouth daily.     chlorpheniramine-HYDROcodone 10-8 MG/5ML Lqcr  Commonly known as:  TUSSIONEX PENNKINETIC ER  Take 2.5 ml to 5 ml every 12 hours prn cough. Will cause drowsiness.     fluticasone 50 MCG/ACT nasal spray  Commonly known as:  FLONASE  Place 2 sprays into both nostrils daily.     terbinafine 250 MG tablet  Commonly known as:  LAMISIL  Take 1 tablet (250 mg total) by mouth daily.     Vitamin D (Ergocalciferol) 50000 UNITS Caps capsule  Commonly known as:  DRISDOL  Take 1 capsule (50,000 Units total) by mouth every 7 (seven) days.        Meds ordered this encounter  Medications  . cetirizine (ZYRTEC) 10 MG tablet    Sig: Take 1 tablet (10 mg total) by mouth daily.    Dispense:  30 tablet   Refill:  3     There is no immunization history on file for this patient.  Family History  Problem Relation Age of Onset  . Diabetes Father   . Heart disease Maternal Grandmother     History  Substance Use Topics  . Smoking status: Never Smoker   . Smokeless tobacco: Not on file  . Alcohol Use: No    Review of Systems   As noted in HPI  Filed Vitals:   05/15/14 1649  BP: 130/81  Pulse: 78  Temp: 98.2 F (36.8 C)  Resp: 16    Physical Exam  Physical Exam  Eyes: EOM are normal. Pupils are equal, round, and reactive to light.  Cardiovascular: Normal rate and regular rhythm.   Pulmonary/Chest: Breath sounds normal. No respiratory distress. She has no wheezes. She has no rales.  Skin:  Patient examined in presence of medical staff as chaperon Left buttock  1 to 2 cm raised nevus, non tender no discharge , no sign of infection.    CBC    Component Value Date/Time   WBC 5.1 12/16/2013 1550   RBC 4.38 12/16/2013 1550   HGB 13.5 12/16/2013 1550   HCT 39.1  12/16/2013 1550   PLT 194 12/16/2013 1550   MCV 89.3 12/16/2013 1550   LYMPHSABS 1.8 12/16/2013 1550   MONOABS 0.5 12/16/2013 1550   EOSABS 0.3 12/16/2013 1550   BASOSABS 0.0 12/16/2013 1550    CMP     Component Value Date/Time   NA 136 04/14/2014 1010   K 4.2 04/14/2014 1010   CL 102 04/14/2014 1010   CO2 28 04/14/2014 1010   GLUCOSE 92 04/14/2014 1010   BUN 11 04/14/2014 1010   CREATININE 0.61 04/14/2014 1010   CREATININE 0.60 10/05/2009 2124   CALCIUM 9.7 04/14/2014 1010   PROT 8.3 04/14/2014 1010   ALBUMIN 4.3 04/14/2014 1010   AST 26 04/14/2014 1010   ALT 37* 04/14/2014 1010   ALKPHOS 82 04/14/2014 1010   BILITOT 0.6 04/14/2014 1010   GFRNONAA >89 12/16/2013 1550   GFRAA >89 12/16/2013 1550    Lab Results  Component Value Date/Time   CHOL 156 12/16/2013  3:50 PM    No components found with this basename: hga1c    Lab Results  Component Value Date/Time   AST 26 04/14/2014 10:10 AM    Assessment and Plan  Nevus - Plan: Has been  possible change in size and color, I have referred her to dermatology  Ambulatory referral to Dermatology  Itching - Plan: cetirizine (ZYRTEC) 10 MG tablet when necessary for itching   Return in about 3 months (around 08/15/2014) for toenail fungus.  Lorayne Marek, MD

## 2014-05-15 NOTE — Progress Notes (Signed)
Patient here for mole to her right buttocks States the area around the mole is very itchy

## 2014-05-18 DIAGNOSIS — D229 Melanocytic nevi, unspecified: Secondary | ICD-10-CM | POA: Insufficient documentation

## 2014-06-17 ENCOUNTER — Encounter: Payer: Self-pay | Admitting: Internal Medicine

## 2014-06-17 ENCOUNTER — Ambulatory Visit: Payer: Worker's Compensation | Attending: Internal Medicine | Admitting: Internal Medicine

## 2014-06-17 VITALS — BP 129/81 | HR 60 | Temp 97.9°F | Resp 14 | Ht 66.0 in | Wt 188.0 lb

## 2014-06-17 DIAGNOSIS — R51 Headache: Secondary | ICD-10-CM

## 2014-06-17 DIAGNOSIS — K219 Gastro-esophageal reflux disease without esophagitis: Secondary | ICD-10-CM | POA: Insufficient documentation

## 2014-06-17 DIAGNOSIS — R1013 Epigastric pain: Secondary | ICD-10-CM

## 2014-06-17 LAB — CBC WITH DIFFERENTIAL/PLATELET
Basophils Absolute: 0 10*3/uL (ref 0.0–0.1)
Basophils Relative: 1 % (ref 0–1)
Eosinophils Absolute: 0.1 10*3/uL (ref 0.0–0.7)
Eosinophils Relative: 3 % (ref 0–5)
HCT: 39.6 % (ref 36.0–46.0)
Hemoglobin: 14 g/dL (ref 12.0–15.0)
LYMPHS ABS: 1.6 10*3/uL (ref 0.7–4.0)
Lymphocytes Relative: 39 % (ref 12–46)
MCH: 30.9 pg (ref 26.0–34.0)
MCHC: 35.4 g/dL (ref 30.0–36.0)
MCV: 87.4 fL (ref 78.0–100.0)
Monocytes Absolute: 0.5 10*3/uL (ref 0.1–1.0)
Monocytes Relative: 12 % (ref 3–12)
NEUTROS ABS: 1.8 10*3/uL (ref 1.7–7.7)
NEUTROS PCT: 45 % (ref 43–77)
PLATELETS: 167 10*3/uL (ref 150–400)
RBC: 4.53 MIL/uL (ref 3.87–5.11)
RDW: 13.8 % (ref 11.5–15.5)
WBC: 4 10*3/uL (ref 4.0–10.5)

## 2014-06-17 LAB — COMPLETE METABOLIC PANEL WITH GFR
ALT: 39 U/L — ABNORMAL HIGH (ref 0–35)
AST: 33 U/L (ref 0–37)
Albumin: 4.2 g/dL (ref 3.5–5.2)
Alkaline Phosphatase: 79 U/L (ref 39–117)
BUN: 12 mg/dL (ref 6–23)
CO2: 26 mEq/L (ref 19–32)
Calcium: 9.2 mg/dL (ref 8.4–10.5)
Chloride: 103 mEq/L (ref 96–112)
Creat: 0.63 mg/dL (ref 0.50–1.10)
GFR, Est Non African American: >89 mL/min
Glucose, Bld: 83 mg/dL (ref 70–99)
Potassium: 4.6 mEq/L (ref 3.5–5.3)
Sodium: 137 mEq/L (ref 135–145)
Total Bilirubin: 0.6 mg/dL (ref 0.2–1.2)
Total Protein: 8.2 g/dL (ref 6.0–8.3)

## 2014-06-17 LAB — LIPASE: LIPASE: 25 U/L (ref 0–75)

## 2014-06-17 MED ORDER — OMEPRAZOLE 20 MG PO CPDR: 20.0000 mg | DELAYED_RELEASE_CAPSULE | Freq: Every day | ORAL | Status: DC

## 2014-06-17 NOTE — Progress Notes (Signed)
Pt is here because she is have headaches and her left side of her face is swollen. Pt states that she is having pain in her upper abdomen and her mid back.

## 2014-06-17 NOTE — Progress Notes (Signed)
MRN: [id number] Name: Melanie Durham  Sex: female Age: 53 y.o. DOB: 1961-10-17  Allergies: Penicillins  Chief Complaint  Patient presents with  . Follow-up    HPI: Patient is 53 y.o. female who comes today reported to have noticed some swelling on her face, yesterday she also had a headache but denies any currently denies any URI symptoms denies any ear pain stuffy nose sore throat, she reported to have lot of GERD symptoms and upper abdominal pain she felt nauseous yesterday denies any vomiting denies any change in bowel habits. Patient denies any numbness weakness.  Past Medical History  Diagnosis Date  . Bronchitis     History reviewed. No pertinent past surgical history.    Medication List       This list is accurate as of: 06/17/14 10:13 AM.  Always use your most recent med list.               albuterol 108 (90 BASE) MCG/ACT inhaler  Commonly known as:  PROVENTIL HFA;VENTOLIN HFA  Inhale 2 puffs into the lungs every 6 (six) hours as needed for wheezing or shortness of breath.     benzonatate 100 MG capsule  Commonly known as:  TESSALON  Take 1 capsule (100 mg total) by mouth 3 (three) times daily as needed for cough.     cetirizine 10 MG tablet  Commonly known as:  ZYRTEC  Take 1 tablet (10 mg total) by mouth daily.     chlorpheniramine-HYDROcodone 10-8 MG/5ML Lqcr  Commonly known as:  TUSSIONEX PENNKINETIC ER  Take 2.5 ml to 5 ml every 12 hours prn cough. Will cause drowsiness.     fluticasone 50 MCG/ACT nasal spray  Commonly known as:  FLONASE  Place 2 sprays into both nostrils daily.     omeprazole 20 MG capsule  Commonly known as:  PRILOSEC  Take 1 capsule (20 mg total) by mouth daily.     terbinafine 250 MG tablet  Commonly known as:  LAMISIL  Take 1 tablet (250 mg total) by mouth daily.     Vitamin D (Ergocalciferol) 50000 UNITS Caps capsule  Commonly known as:  DRISDOL  Take 1 capsule (50,000 Units total) by mouth every 7 (seven) days.         Meds ordered this encounter  Medications  . omeprazole (PRILOSEC) 20 MG capsule    Sig: Take 1 capsule (20 mg total) by mouth daily.    Dispense:  30 capsule    Refill:  3     There is no immunization history on file for this patient.  Family History  Problem Relation Age of Onset  . Diabetes Father   . Heart disease Maternal Grandmother     History  Substance Use Topics  . Smoking status: Never Smoker   . Smokeless tobacco: Not on file  . Alcohol Use: No    Review of Systems   As noted in HPI  Filed Vitals:   06/17/14 0942  BP: 129/81  Pulse: 60  Temp: 97.9 F (36.6 C)  Resp: 14    Physical Exam  Physical Exam  Constitutional: No distress.  Eyes: EOM are normal. Pupils are equal, round, and reactive to light.  Cardiovascular: Normal rate and regular rhythm.   Pulmonary/Chest: Breath sounds normal. No respiratory distress. She has no wheezes. She has no rales.  Abdominal: There is no rebound and no guarding.  Epigastric tenderness   Musculoskeletal: She exhibits no edema.  Neurological:  Equal strength  all extremities, DTR 2+    CBC    Component Value Date/Time   WBC 5.1 12/16/2013 1550   RBC 4.38 12/16/2013 1550   HGB 13.5 12/16/2013 1550   HCT 39.1 12/16/2013 1550   PLT 194 12/16/2013 1550   MCV 89.3 12/16/2013 1550   LYMPHSABS 1.8 12/16/2013 1550   MONOABS 0.5 12/16/2013 1550   EOSABS 0.3 12/16/2013 1550   BASOSABS 0.0 12/16/2013 1550    CMP     Component Value Date/Time   NA 136 04/14/2014 1010   K 4.2 04/14/2014 1010   CL 102 04/14/2014 1010   CO2 28 04/14/2014 1010   GLUCOSE 92 04/14/2014 1010   BUN 11 04/14/2014 1010   CREATININE 0.61 04/14/2014 1010   CREATININE 0.60 10/05/2009 2124   CALCIUM 9.7 04/14/2014 1010   PROT 8.3 04/14/2014 1010   ALBUMIN 4.3 04/14/2014 1010   AST 26 04/14/2014 1010   ALT 37* 04/14/2014 1010   ALKPHOS 82 04/14/2014 1010   BILITOT 0.6 04/14/2014 1010   GFRNONAA >89 12/16/2013 1550   GFRAA >89 12/16/2013 1550    Lab Results  Component  Value Date/Time   CHOL 156 12/16/2013  3:50 PM    No components found with this basename: hga1c    Lab Results  Component Value Date/Time   AST 26 04/14/2014 10:10 AM    Assessment and Plan  Abdominal pain, epigastric - Plan: I have ordered her US Abdomen Complete, will check her COMPLETE METABOLIC PANEL WITH GFR, CBC with Differential, Lipase  Gastroesophageal reflux disease without esophagitis - Plan: Trial of omeprazole (PRILOSEC) 20 MG capsule  Headache(784.0) Now resolved     Return in about 3 months (around 09/17/2014), or if symptoms worsen or fail to improve.  Lorayne Marek, MD

## 2014-06-19 ENCOUNTER — Telehealth: Payer: Self-pay

## 2014-06-19 ENCOUNTER — Ambulatory Visit (HOSPITAL_COMMUNITY)
Admission: RE | Admit: 2014-06-19 | Discharge: 2014-06-19 | Disposition: A | Discharge status: Home / Self Care | Payer: No Typology Code available for payment source | Source: Ambulatory Visit | Attending: Internal Medicine | Admitting: Internal Medicine

## 2014-06-19 DIAGNOSIS — R1013 Epigastric pain: Secondary | ICD-10-CM

## 2014-06-19 DIAGNOSIS — R109 Unspecified abdominal pain: Secondary | ICD-10-CM | POA: Insufficient documentation

## 2014-06-19 NOTE — Telephone Encounter (Signed)
Message copied by Dorothe Pea on Fri Jun 19, 2014 11:05 AM ------      Message from: Lorayne Marek      Created: Fri Jun 19, 2014 11:02 AM       Collar the patient know that her abdominal ultrasound is normal. ------

## 2014-06-19 NOTE — Telephone Encounter (Signed)
Interpreter line used Patient is aware of her ultra sound results

## 2014-08-14 ENCOUNTER — Ambulatory Visit: Payer: Self-pay | Admitting: Internal Medicine

## 2014-08-14 ENCOUNTER — Encounter: Payer: Self-pay | Admitting: Internal Medicine

## 2014-08-14 ENCOUNTER — Ambulatory Visit: Payer: No Typology Code available for payment source | Attending: Internal Medicine | Admitting: Internal Medicine

## 2014-08-14 VITALS — BP 145/94 | HR 65 | Temp 98.3°F | Resp 16 | Ht 65.0 in | Wt 189.0 lb

## 2014-08-14 DIAGNOSIS — J069 Acute upper respiratory infection, unspecified: Secondary | ICD-10-CM | POA: Insufficient documentation

## 2014-08-14 DIAGNOSIS — Z0000 Encounter for general adult medical examination without abnormal findings: Secondary | ICD-10-CM

## 2014-08-14 DIAGNOSIS — Z833 Family history of diabetes mellitus: Secondary | ICD-10-CM | POA: Insufficient documentation

## 2014-08-14 DIAGNOSIS — K219 Gastro-esophageal reflux disease without esophagitis: Secondary | ICD-10-CM

## 2014-08-14 DIAGNOSIS — Z79899 Other long term (current) drug therapy: Secondary | ICD-10-CM | POA: Insufficient documentation

## 2014-08-14 DIAGNOSIS — Z23 Encounter for immunization: Secondary | ICD-10-CM

## 2014-08-14 DIAGNOSIS — F430 Acute stress reaction: Secondary | ICD-10-CM | POA: Insufficient documentation

## 2014-08-14 DIAGNOSIS — B351 Tinea unguium: Secondary | ICD-10-CM | POA: Insufficient documentation

## 2014-08-14 LAB — POCT GLYCOSYLATED HEMOGLOBIN (HGB A1C): HEMOGLOBIN A1C: 5.6

## 2014-08-14 MED ORDER — AZITHROMYCIN 250 MG PO TABS: ORAL_TABLET | ORAL | Status: DC

## 2014-08-14 NOTE — Progress Notes (Signed)
Pt here for f/u nail/toe infection with taking Lamisil Pt states she stopped taking Lamisil twice due to URI Requesting flu vaccine Requesting testing for Diabetes

## 2014-08-14 NOTE — Progress Notes (Signed)
MRN: [id number] Name: Melanie Durham  Sex: female Age: 53 y.o. DOB: 1961/07/17  Allergies: Penicillins  Chief Complaint  Patient presents with  . Follow-up  . Nail Problem    HPI: Patient is 53 y.o. female who has history of allergy rhinitis nasal congestion currently patient is on Flomax reported to have postnasal drip headache scratchy throat hoarseness of voice, fever chills for last one week, denies any chest or shortness of breath, she denies smoking cigarettes, she has been taking over-the-counter Tylenol/ibuprofen helped with a cold symptoms, she denies any nausea vomiting change in bowel habits. Patient also takes Lamisil for toenail fungus which last 2 weeks she has stopped taking. Patient also has family history of diabetes and wants to be checked for that. Past Medical History  Diagnosis Date  . Bronchitis     History reviewed. No pertinent past surgical history.    Medication List       This list is accurate as of: 08/14/14 10:15 AM.  Always use your most recent med list.               albuterol 108 (90 BASE) MCG/ACT inhaler  Commonly known as:  PROVENTIL HFA;VENTOLIN HFA  Inhale 2 puffs into the lungs every 6 (six) hours as needed for wheezing or shortness of breath.     azithromycin 250 MG tablet  Commonly known as:  ZITHROMAX Z-PAK  Take as directed     benzonatate 100 MG capsule  Commonly known as:  TESSALON  Take 1 capsule (100 mg total) by mouth 3 (three) times daily as needed for cough.     cetirizine 10 MG tablet  Commonly known as:  ZYRTEC  Take 1 tablet (10 mg total) by mouth daily.     chlorpheniramine-HYDROcodone 10-8 MG/5ML Lqcr  Commonly known as:  TUSSIONEX PENNKINETIC ER  Take 2.5 ml to 5 ml every 12 hours prn cough. Will cause drowsiness.     fluticasone 50 MCG/ACT nasal spray  Commonly known as:  FLONASE  Place 2 sprays into both nostrils daily.     omeprazole 20 MG capsule  Commonly known as:  PRILOSEC  Take 1 capsule  (20 mg total) by mouth daily.     terbinafine 250 MG tablet  Commonly known as:  LAMISIL  Take 1 tablet (250 mg total) by mouth daily.     Vitamin D (Ergocalciferol) 50000 UNITS Caps capsule  Commonly known as:  DRISDOL  Take 1 capsule (50,000 Units total) by mouth every 7 (seven) days.        Meds ordered this encounter  Medications  . azithromycin (ZITHROMAX Z-PAK) 250 MG tablet    Sig: Take as directed    Dispense:  6 each    Refill:  0     There is no immunization history on file for this patient.  Family History  Problem Relation Age of Onset  . Diabetes Father   . Heart disease Maternal Grandmother     History  Substance Use Topics  . Smoking status: Never Smoker   . Smokeless tobacco: Not on file  . Alcohol Use: No    Review of Systems   As noted in HPI  Filed Vitals:   08/14/14 0948  BP: 145/94  Pulse: 65  Temp: 98.3 F (36.8 C)  Resp: 16    Physical Exam  Physical Exam  HENT:  Nasal congestion no sinus tenderness, enlarged tonsills, no exudate,   Eyes: EOM are normal. Pupils are equal, round,  and reactive to light.  Cardiovascular: Normal rate and regular rhythm.   Pulmonary/Chest: Breath sounds normal. No respiratory distress. She has no wheezes. She has no rales.  Musculoskeletal: She exhibits no edema.  Lymphadenopathy:    She has no cervical adenopathy.    CBC    Component Value Date/Time   WBC 4.0 06/17/2014 1013   RBC 4.53 06/17/2014 1013   HGB 14.0 06/17/2014 1013   HCT 39.6 06/17/2014 1013   PLT 167 06/17/2014 1013   MCV 87.4 06/17/2014 1013   LYMPHSABS 1.6 06/17/2014 1013   MONOABS 0.5 06/17/2014 1013   EOSABS 0.1 06/17/2014 1013   BASOSABS 0.0 06/17/2014 1013    CMP     Component Value Date/Time   NA 137 06/17/2014 1013   K 4.6 06/17/2014 1013   CL 103 06/17/2014 1013   CO2 26 06/17/2014 1013   GLUCOSE 83 06/17/2014 1013   BUN 12 06/17/2014 1013   CREATININE 0.63 06/17/2014 1013   CREATININE 0.60 10/05/2009 2124   CALCIUM 9.2 06/17/2014 1013    PROT 8.2 06/17/2014 1013   ALBUMIN 4.2 06/17/2014 1013   AST 33 06/17/2014 1013   ALT 39* 06/17/2014 1013   ALKPHOS 79 06/17/2014 1013   BILITOT 0.6 06/17/2014 1013   GFRNONAA >89 06/17/2014 1013   GFRAA >89 06/17/2014 1013    Lab Results  Component Value Date/Time   CHOL 156 12/16/2013  3:50 PM    No components found with this basename: hga1c    Lab Results  Component Value Date/Time   AST 33 06/17/2014 10:13 AM    Assessment and Plan  Visit for preventive health examination - Plan:  Results for orders placed in visit on 08/14/14  POCT GLYCOSYLATED HEMOGLOBIN (HGB A1C)      Result Value Ref Range   Hemoglobin A1C 5.6     HgB A1c is 5.6%, patient is not diabetic.  Gastroesophageal reflux disease without esophagitis Lifestyle modification, Prilosec.  Nail fungal infection Patient will resume back on Lamisil and advise not to continue more than 3 months, recent CBC and blood chemistry was normal, will repeat blood chemistry and CBC on the next visit.Marland Kitchen  URI (upper respiratory infection) - Plan: azithromycin (ZITHROMAX Z-PAK) 250 MG tablet, also advise patient for saltwater gargles, increase fluid intake.   Health Maintenance  -Vaccinations:   -Influenza given today   Return in about 3 months (around 11/13/2014).  Lorayne Marek, MD

## 2014-10-13 ENCOUNTER — Encounter: Payer: Self-pay | Admitting: Internal Medicine

## 2014-10-13 ENCOUNTER — Ambulatory Visit: Payer: No Typology Code available for payment source | Attending: Internal Medicine | Admitting: Internal Medicine

## 2014-10-13 VITALS — BP 132/82 | HR 76 | Temp 98.0°F | Resp 16 | Wt 192.4 lb

## 2014-10-13 DIAGNOSIS — R198 Other specified symptoms and signs involving the digestive system and abdomen: Secondary | ICD-10-CM

## 2014-10-13 DIAGNOSIS — R0989 Other specified symptoms and signs involving the circulatory and respiratory systems: Secondary | ICD-10-CM

## 2014-10-13 DIAGNOSIS — J351 Hypertrophy of tonsils: Secondary | ICD-10-CM | POA: Insufficient documentation

## 2014-10-13 DIAGNOSIS — Z79899 Other long term (current) drug therapy: Secondary | ICD-10-CM | POA: Insufficient documentation

## 2014-10-13 DIAGNOSIS — J029 Acute pharyngitis, unspecified: Secondary | ICD-10-CM | POA: Insufficient documentation

## 2014-10-13 DIAGNOSIS — R6889 Other general symptoms and signs: Secondary | ICD-10-CM

## 2014-10-13 LAB — POCT RAPID STREP A (OFFICE): Rapid Strep A Screen: NEGATIVE

## 2014-10-13 MED ORDER — AZITHROMYCIN 250 MG PO TABS: ORAL_TABLET | ORAL | Status: DC

## 2014-10-13 NOTE — Progress Notes (Signed)
MRN: [id number] Name: Melanie Durham  Sex: female Age: 53 y.o. DOB: Oct 10, 1961  Allergies: Penicillins  Chief Complaint  Patient presents with  . Sore Throat    HPI: Patient is 53 y.o. female who comes today reported to have sore throat and fullness for the last 2 months, she complaints of chills denies any fever denies any cough or any nasal congestion,she does have history of GERD and is on Prilosec, today her repeat strep test is negative. Patient denies any chest pain or shortness of breath. Patient denies smoking cigarettes.  Past Medical History  Diagnosis Date  . Bronchitis     History reviewed. No pertinent past surgical history.    Medication List       This list is accurate as of: 10/13/14  6:26 PM.  Always use your most recent med list.               albuterol 108 (90 BASE) MCG/ACT inhaler  Commonly known as:  PROVENTIL HFA;VENTOLIN HFA  Inhale 2 puffs into the lungs every 6 (six) hours as needed for wheezing or shortness of breath.     azithromycin 250 MG tablet  Commonly known as:  ZITHROMAX Z-PAK  Take as directed     azithromycin 250 MG tablet  Commonly known as:  ZITHROMAX Z-PAK  Take as directed     benzonatate 100 MG capsule  Commonly known as:  TESSALON  Take 1 capsule (100 mg total) by mouth 3 (three) times daily as needed for cough.     cetirizine 10 MG tablet  Commonly known as:  ZYRTEC  Take 1 tablet (10 mg total) by mouth daily.     chlorpheniramine-HYDROcodone 10-8 MG/5ML Lqcr  Commonly known as:  TUSSIONEX PENNKINETIC ER  Take 2.5 ml to 5 ml every 12 hours prn cough. Will cause drowsiness.     fluticasone 50 MCG/ACT nasal spray  Commonly known as:  FLONASE  Place 2 sprays into both nostrils daily.     omeprazole 20 MG capsule  Commonly known as:  PRILOSEC  Take 1 capsule (20 mg total) by mouth daily.     terbinafine 250 MG tablet  Commonly known as:  LAMISIL  Take 1 tablet (250 mg total) by mouth daily.     Vitamin  D (Ergocalciferol) 50000 UNITS Caps capsule  Commonly known as:  DRISDOL  Take 1 capsule (50,000 Units total) by mouth every 7 (seven) days.        Meds ordered this encounter  Medications  . azithromycin (ZITHROMAX Z-PAK) 250 MG tablet    Sig: Take as directed    Dispense:  6 each    Refill:  0    Immunization History  Administered Date(s) Administered  . Influenza,inj,Quad PF,36+ Mos 08/14/2014    Family History  Problem Relation Age of Onset  . Diabetes Father   . Heart disease Maternal Grandmother     History  Substance Use Topics  . Smoking status: Never Smoker   . Smokeless tobacco: Not on file  . Alcohol Use: No    Review of Systems   As noted in HPI  Filed Vitals:   10/13/14 1726  BP: 132/82  Pulse: 76  Temp: 98 F (36.7 C)  Resp: 16    Physical Exam  Physical Exam  Constitutional: No distress.  HENT:  Minimal nasal congestion no sinus tenderness, enlarged tonsils no exudate  Eyes: EOM are normal. Pupils are equal, round, and reactive to light.  Cardiovascular: Normal  rate and regular rhythm.   Pulmonary/Chest: Breath sounds normal. No respiratory distress. She has no wheezes. She has no rales.  Lymphadenopathy:    She has cervical adenopathy.    CBC    Component Value Date/Time   WBC 4.0 06/17/2014 1013   RBC 4.53 06/17/2014 1013   HGB 14.0 06/17/2014 1013   HCT 39.6 06/17/2014 1013   PLT 167 06/17/2014 1013   MCV 87.4 06/17/2014 1013   LYMPHSABS 1.6 06/17/2014 1013   MONOABS 0.5 06/17/2014 1013   EOSABS 0.1 06/17/2014 1013   BASOSABS 0.0 06/17/2014 1013    CMP     Component Value Date/Time   NA 137 06/17/2014 1013   K 4.6 06/17/2014 1013   CL 103 06/17/2014 1013   CO2 26 06/17/2014 1013   GLUCOSE 83 06/17/2014 1013   BUN 12 06/17/2014 1013   CREATININE 0.63 06/17/2014 1013   CREATININE 0.60 10/05/2009 2124   CALCIUM 9.2 06/17/2014 1013   PROT 8.2 06/17/2014 1013   ALBUMIN 4.2 06/17/2014 1013   AST 33 06/17/2014 1013    ALT 39* 06/17/2014 1013   ALKPHOS 79 06/17/2014 1013   BILITOT 0.6 06/17/2014 1013   GFRNONAA >89 06/17/2014 1013   GFRAA >89 06/17/2014 1013    Lab Results  Component Value Date/Time   CHOL 156 12/16/2013 03:50 PM    No components found for: HGA1C  Lab Results  Component Value Date/Time   AST 33 06/17/2014 10:13 AM    Assessment and Plan  Sore throat - Plan: Rapid Strep A is  negative, will send herThroat culture ([facility]), azithromycin (ZITHROMAX Z-PAK) 250 MG tablet  Throat fullness/Enlarged tonsils  - Plan: Ambulatory referral to ENT     Return in about 3 months (around 01/13/2015), or if symptoms worsen or fail to improve.  Lorayne Marek, MD

## 2014-10-13 NOTE — Progress Notes (Signed)
Patient here with daughter Complains of sore throat that has not gotten better Started over a month ago Patient is afraid of thyroid disease because it runs in the family

## 2014-10-15 LAB — CULTURE, GROUP A STREP: ORGANISM ID, BACTERIA: NORMAL

## 2014-12-11 ENCOUNTER — Encounter (HOSPITAL_COMMUNITY): Payer: Self-pay | Admitting: Emergency Medicine

## 2014-12-11 ENCOUNTER — Emergency Department (HOSPITAL_COMMUNITY)
Admission: EM | Admit: 2014-12-11 | Discharge: 2014-12-11 | Disposition: A | Discharge status: Home / Self Care | Payer: No Typology Code available for payment source | Source: Home / Self Care | Attending: Family Medicine | Admitting: Family Medicine

## 2014-12-11 DIAGNOSIS — M722 Plantar fascial fibromatosis: Secondary | ICD-10-CM

## 2014-12-11 DIAGNOSIS — K760 Fatty (change of) liver, not elsewhere classified: Secondary | ICD-10-CM

## 2014-12-11 DIAGNOSIS — I862 Pelvic varices: Secondary | ICD-10-CM

## 2014-12-11 HISTORY — DX: Fatty (change of) liver, not elsewhere classified: K76.0

## 2014-12-11 HISTORY — DX: Pelvic varices: I86.2

## 2014-12-11 MED ORDER — DICLOFENAC POTASSIUM 50 MG PO TABS: 50.0000 mg | ORAL_TABLET | Freq: Three times a day (TID) | ORAL | Status: DC

## 2014-12-11 NOTE — ED Provider Notes (Signed)
CSN: [id number]     Arrival date & time 12/11/14  1543 History   First MD Initiated Contact with Patient 12/11/14 1601     No chief complaint on file.  (Consider location/radiation/quality/duration/timing/severity/associated sxs/prior Treatment) HPI Comments: 54 year old female states that she has a job in which she is standing on her feet for prolonged periods of time. She is complaining of pain to the plantar aspect of the foot primarily in the heel. It is greatest upon awakening in the morning and complaining her first on the ground for the first time. She denies any new injury.   Past Medical History  Diagnosis Date  . Bronchitis    No past surgical history on file. Family History  Problem Relation Age of Onset  . Diabetes Father   . Heart disease Maternal Grandmother    History  Substance Use Topics  . Smoking status: Never Smoker   . Smokeless tobacco: Not on file  . Alcohol Use: No   OB History    No data available     Review of Systems  Constitutional: Positive for fever. Negative for chills and activity change.  HENT: Negative.   Respiratory: Negative.   Musculoskeletal: Negative for back pain.       As per HPI  Skin: Negative for color change, pallor and rash.  Neurological: Negative.     Allergies  Penicillins  Home Medications   Prior to Admission medications   Medication Sig Start Date End Date Taking? Authorizing Provider  albuterol (PROVENTIL HFA;VENTOLIN HFA) 108 (90 BASE) MCG/ACT inhaler Inhale 2 puffs into the lungs every 6 (six) hours as needed for wheezing or shortness of breath. 12/06/13   Janne Napoleon, NP  azithromycin (ZITHROMAX Z-PAK) 250 MG tablet Take as directed 08/14/14   Lorayne Marek, MD  azithromycin (ZITHROMAX Z-PAK) 250 MG tablet Take as directed 10/13/14   Lorayne Marek, MD  benzonatate (TESSALON) 100 MG capsule Take 1 capsule (100 mg total) by mouth 3 (three) times daily as needed for cough. 12/16/13   Lorayne Marek, MD  cetirizine (ZYRTEC)  10 MG tablet Take 1 tablet (10 mg total) by mouth daily. 05/15/14   Lorayne Marek, MD  chlorpheniramine-HYDROcodone (TUSSIONEX PENNKINETIC ER) 10-8 MG/5ML LQCR Take 2.5 ml to 5 ml every 12 hours prn cough. Will cause drowsiness. 12/06/13   Janne Napoleon, NP  diclofenac (CATAFLAM) 50 MG tablet Take 1 tablet (50 mg total) by mouth 3 (three) times daily. One tablet TID with food prn pain. 12/11/14   Janne Napoleon, NP  fluticasone (FLONASE) 50 MCG/ACT nasal spray Place 2 sprays into both nostrils daily. 12/16/13   Lorayne Marek, MD  omeprazole (PRILOSEC) 20 MG capsule Take 1 capsule (20 mg total) by mouth daily. 06/17/14   Lorayne Marek, MD  terbinafine (LAMISIL) 250 MG tablet Take 1 tablet (250 mg total) by mouth daily. 04/14/14   Debbe Odea, MD  Vitamin D, Ergocalciferol, (DRISDOL) 50000 UNITS CAPS capsule Take 1 capsule (50,000 Units total) by mouth every 7 (seven) days. 12/17/13   Lorayne Marek, MD   BP 121/79 mmHg  Pulse 64  Temp(Src) 98 F (36.7 C) (Oral)  Resp 16  SpO2 97% Physical Exam  Constitutional: She is oriented to person, place, and time. She appears well-developed and well-nourished. No distress.  Pulmonary/Chest: Effort normal. No respiratory distress.  Musculoskeletal: She exhibits no edema.  Right foot without swelling, deformity, discoloration or other signs of injury. No tenderness to the dorsum of the foot or along the metacarpals. There is  marked tenderness to the plantar aspect and clear lung heel and arch.  Neurological: She is alert and oriented to person, place, and time.  Skin: Skin is warm and dry.  Nursing note and vitals reviewed.   ED Course  Procedures (including critical care time) Labs Review Labs Reviewed - No data to display  Imaging Review No results found.   MDM   1. Plantar fasciitis of right foot    Cold applications, roll foot over frozen can Full length arch support with high arch Limit standing as much as possible See PCP/Podiatrist [place]  Rx    Janne Napoleon, NP 12/11/14 1622

## 2014-12-11 NOTE — ED Notes (Signed)
C/o foot pain, no known specific injury

## 2014-12-11 NOTE — Discharge Instructions (Signed)
Fascitis plantar (Plantar Fascitis) La fascitis plantar es un trastorno frecuente que ocasiona dolor en el pie. Se trata de una inflamacin (irritacin) de la banda de tejidos fibrosos y resistentes que se extienden desde el hueso del taln (calcneo) hasta la parte anterior de la planta del pie. Esta inflamacin puede deberse a que ha permanecido Kellogg, a zapatos que no Archivist, a correr [employer] Kroger, al sobrepeso, a un andar anormal y el uso excesivo del pie con dolor (esto es frecuente en los corredores). Tambin es frecuente [employer] que practican ejercicios aerbicos y [place] bailarines de ballet. SNTOMAS La persona que sufre fascitis plantar manifiesta:  Dolor intenso por la Lockheed Martin parte inferior del pie, especialmente al dar los primeros pasos luego de levantarse de la cama. Este dolor disminuye luego de caminar algunos minutos.  Dolor intenso al caminar V[employer]om de un tiempo prolongado de inactividad.  El dolor empeora al caminar descalzo o al subir escaleras. DIAGNSTICO  El mdico har el diagnstico examinando sus pies.  En general no es necesario indicar radiografas. PREVENCIN  Consulte a Insurance claims handler en medicina del deporte antes de comenzar un nuevo programa de ejercicios.  Los programas de caminatas ofrecen un buen entrenamiento. Hay una menor probabilidad de sufrir lesiones por uso excesivo, que son frecuentes Lehman Brothers personas que corren. Hay menos impacto y menos agresin a las articulaciones.  Comience lentamente todo nuevo programa de ejercicios. Si aparece algn problema o dolor, disminuya la cantidad de tiempo o la distancia hasta que se encuentre cmodo.  Use calzado de buena calidad y reemplcelo regularmente.  Estire el pie y los ligamentos que se encuentran en la parte posterior del tobillo (tendn de [place]) antes y despus de Optometrist actividad fsica.  Corra o practique ejercicios sobre superficies  parejas que no sean duras. Por ejemplo, el asfalto es mejor que el pavimento.  No corra descalzo sobre superficies duras.  Si camina sobre cinta, vare la inclinacin.  No siga con el entrenamiento si tiene problemas en el pie o en la articulacin. Busque ayuda profesional si no mejora. INSTRUCCIONES PARA EL CUIDADO DOMICILIARIO  Evite las [employer] causan dolor hasta que se recupere.  Use hielo o compresas fras sobre las zonas doloridas despus de Optometrist ejercicios.  Only take over-the-counter or prescription medicines for pain, discomfort, or fever as directed by your caregiver.  Greene zapatillas con base de aire o gel pueden ser de [place].  Si los problemas continan o se agravan, consulte a Teaching laboratory technician en medicina del deporte o con su mdico personal. La cortisona es un potente antiinflamatorio que puede inyectarse en la zona dolorida. El profesional que lo asiste comentar este tratamiento con usted. EST SEGURO QUE:   Comprende las instrucciones para el alta mdica.  Controlar su enfermedad.  Solicitar atencin mdica de inmediato segn las indicaciones. Document Released: 09/06/2005 Document Revised: 02/19/2012 [facility] Patient Information 2015 [place]. This information is not intended to replace advice given to you by your health care provider. Make sure you discuss any questions you have with your health care provider.  Fascitis plantar (sndrome del espoln en el taln) con rehabilitacin (Plantar Fasciitis, Heel Spur Syndrome, with Rehab) La fascia plantar es una estructura fibrosa, tipo ligamento, de tejido blando que abarca la parte inferior del pie. La fascitis plantar es una enfermedad que ocasiona dolor en el pie debido a la inflamacin del tejido. SNTOMAS  Dolor y sensibilidad  en la planta del pie.  Dolor especialmente al ponerse de pie o caminar. CAUSAS La fascitis plantar est causada por irritacin y  lesin en la fascia plantar debajo del pie. Los mecanismos ms frecuentes de una lesin son:  [place]pe directo en la planta del pie.  Dao a un pequeo nervio que [employer] Foods, Mudlogger en que la fascia principal se une al hueso del taln.  Estrs aplicado en la fascia plantar debido a espolones seos. EL RIESGO AUMENTA CON:   Actividades que estresan la fascia plantar (correr, saltar, pivotar o cortar).  Poca fuerza y flexibilidad.  Calzado mal ajustado.  Msculos de la pantorrilla tensos.  Pie plano.  No hacer un precalentamiento adecuado.  Obesidad. PREVENCIN  Precalentamiento adecuado y elongacin antes de la [place].  Descanso y recuperacin entre actividades.  Mantener la forma fsica:  Kerry Hough, flexibilidad y resistencia muscular.  Capacidad cardiovascular.  Mantenga un peso corporal adecuado.  Evite el estrs en la fascia plantar.  Para deportistas con pie plano, utilizacin de plantillas anatmicas para los arcos. PRONSTICO Si se trata adecuadamente, generalmente es curable sin [place]. En ocasiones requiere someterse a [place]. POSIBLES COMPLICACIONES  La recurrencia frecuente de los sntomas puede dar como resultado un problema crnico.  Problemas en la cintura causados para compensar la lesin, como renguera.  Dolor o debilidad en el pie al adelantar el pie luego de la [place].  Inflamacin crnica, cicatrizacin y ruptura parcial o completa de la fascia, que se produce luego de repetidas inyecciones. TRATAMIENTO El tratamiento inicial incluye el uso de medicamentos y la aplicacin de hielo para reducir Conservation officer, historic buildings y la inflamacin. Los ejercicios de elongacin y fortalecimiento pueden ayudar a reducir Conservation officer, historic buildings con la actividad, en especial los dirigidos al tendn de [place]. Los ejercicios pueden Press photographer o con un terapeuta. El [employer]om podr recomendar que utilice tacos o soportes para el arco para ayudar a Software engineer de  la fascia plantar. En algunos casos se indica una inyeccin de corticoides para reducir la inflamacin. Si los sntomas persisten por ms de 6 meses de tratamiento no quirrgico (conservador), se Biochemist, clinical.  MEDICAMENTOS  Si necesita analgsicos, se recomiendan los antiinflamatorios no esteroides, como aspirina e ibuprofeno y otros calmantes menores, como acetaminofeno  No tome medicamentos para Conservation officer, historic buildings dentro de los 7 das previos a la [place].  Los analgsicos prescriptos se indicarn si el mdico lo considera necesario. Utilcelos como se le indique y slo cuando lo necesite.  En algunos casos se indica una inyeccin de corticosteroides. Estas inyecciones deben reservarse para los casos graves, porque slo se pueden administrar una determinada cantidad de veces. CALOR Y FRO  El tratamiento con fro [employer] y reduce la inflamacin. El fro debe aplicarse durante 10 a 15 minutos cada 2  3 horas para reducir la inflamacin y Conservation officer, historic buildings e inmediatamente despus de cualquier actividad que agrava los sntomas. Utilice bolsas de hielo o masajee la zona con un trozo de hielo (masaje de hielo).  El calor puede usarse antes de Neurosurgeon y [place] fortalecimiento indicadas por el profesional, le fisioterapeuta o Industrial/product designer. Utilice una bolsa trmica o sumerja la lesin en agua caliente. SOLICITE ATENCIN MDICA DE INMEDIATO SI:  El tratamiento no lo beneficia, o el trastorno empeora.  Los medicamentos producen efectos secundarios. EJERCICIOS EJERCICIOS DE AMPLITUD DE MOVIMIENTOS Y ELONGACIN - Fascitis plantar (sndrome del espoln en el taln) Estos ejercicios le ayudarn en  la recuperacin de la lesin. Los sntomas podrn aliviarse con o sin asistencia adicional de su mdico, fisioterapeuta o Administrator, sports. Al completar estos ejercicios, recuerde:   Restaurar la flexibilidad del tejido ayuda a que las articulaciones recuperen el movimiento normal. Esto permite que el  movimiento y la actividad sea ms saludables y menos dolorosos.  Para que sea efectiva, cada elongacin debe realizarse durante al menos 30 segundos.  La elongacin nunca debe ser dolorosa. Deber sentir slo un alargamiento o distensin suave del tejido que estira. AMPLITUD DE MOVIMIENTOS - Extensin de los dedos - flexin  Sintese con la pierna derecha / izquierdo cruzada sobre la rodilla opuesta.  [employer] dedos de los pies y empjelos hacia usted. Debe sentir un estiramiento suave en la zona inferior de los dedos y del pie.  Mantenga esta posicin durante __________ segundos.  Luego, tome los dedos de los pies y empjelos [place]. Debe sentir un estiramiento suave en la zona superior de los dedos y del pie.  Mantenga esta posicin durante __________ segundos. Reptalo __________ veces. Realice este estiramiento __________ Vicenta Aly por da.  AMPLITUD DE MOVIMIENTOS - Dorsiflexin del tobillo - Rosealee Albee, asistida  Qutese los zapatos y sintese en una silla, preferiblemente en una superficie sin alfombra.  Coloque el pie derecha / izquierdo debajo de la rodilla. Extienda la pierna contraria para estar apoyado.  Con el taln hacia abajo, deslice el pie derecha / izquierdo hacia la silla hasta que sienta un estiramiento en el tobillo o pantorrilla. Si no lo siente, deslice la cadera hacia adelante [employer] borde de la silla, manteniendo el taln [place].  Mantenga esta posicin durante __________ segundos. Reptalo __________ veces. Realice este estiramiento __________ Vicenta Aly por da.  [place] manos en la pared.  Extienda la pierna derecha / izquierdo y Quarry manager la rodilla levemente flexionada.  Apunte los dedos ligeramente hacia adentro con el pie de atrs.  Mantenga el taln derecha / izquierdo en el suelo y la rodilla recta, cambie el peso hacia la pared y no permita que la espalda se arquee.  Debe sentir un estiramiento en la pantorrila  derecha / izquierdo. Mantenga esta posicicin durante __________ segundos. Reptalo __________ veces. Realice este estiramiento __________ Vicenta Aly por da. [place] manos en la pared.  Extienda la pierna derecha / izquierdo y Quarry manager la rodilla levemente flexionada.  Apunte los dedos ligeramente hacia adentro con el pie de atrs.  Mantenga el taln derecha / izquierdo en el suelo, doble la rodilla de atrs y cambie suavemente el peso sobre la pierna de atrs hasta que sienta un ligero estiramiento en la pantorrilla de atrs.  Mantenga esta posicicin durante __________ segundos. Reptalo __________ veces. Realice este estiramiento __________ Vicenta Aly por da. ELONGACIN - Gastrocsoleus De pie Nota: Este ejercicio puede ser muy estresante para el pie y el tobillo. Realice los ejercicios slo en la forma indicada por el profesional que lo asiste.   Coloque la regin metatarsiana del pie derecha / izquierdo y Banker en el mismo escaln.  Si es necesario, sostngase de la pared o de la barandilla de una escalera para mantener el equilibrio.  Levante lentamente el Google, y permita que el peso del cuerpo presione el taln sobre el borde del escaln.  Debe sentir un estiramiento en la pantorrilla derecha / izquierdo.  Mantenga esta posicicin durante __________ segundos.  Repita este ejercicio con la rodilla derecha / izquierdo levemente flexionada. Reptalo  __________ veces. Realice este estiramiento __________ Vicenta Aly por da.  EJERCICIOS DE FORTALECIMIENTO - Fascitis plantar (sndrome del espoln en el taln) Estos ejercicios le ayudarn en la recuperacin de la lesin. Los sntomas podrn desaparecer con o sin mayor intervencin del profesional, el fisioterapeuta o Industrial/product designer. Al completar estos ejercicios, recuerde:   Los msculos pueden ganar tanto la resistencia como la fortaleza que necesita para sus actividades diarias a travs de ejercicios  controlados.  Realice los ejercicios como se lo indic el mdico, el fisioterapeuta o Industrial/product designer. Aumente la resistencia y las repeticiones segn se le haya indicado. [place] de toalla  Sintese en una silla en una superficie no alfombrada.  Coloque el pie en Rozann Lesches, y mantenga el taln en el suelo.  Coloque la toalla alrededor del taln pero envuelva slo los dedos del pie. Mantenga el taln contra el piso.  Aada ____________________ al extremo de la toalla si el mdico, fisioterapeuta o entrenador se lo indican. Reptalo __________ veces. Realice este ejercicio __________ veces por da. FUERZA - Inversin del tobillo  Asegure un extremo de una banda de goma para ejercicios a un objeto fijo (mesa, columna). Ate el extremo opuesto a su pie, justo antes de los dedos.  Coloque los puos Lehman Brothers rodillas. Esto har que la fuerza se concentre en el tobillo.  Lentamente, tire del dedo gordo [place] y Johny Shears y asegrese de que la banda est posicionada de tal forma que puede resistir el movimiento completo.  Mantenga esta posicicin durante __________ segundos.  Haga que los msculos resistan la banda mientras tira lentamente el pie hacia atrs hasta la posicin inicial. Reptalo __________ veces. Realice este ejercicio __________ veces por da.  Document Released: 09/13/2006 Document Revised: 02/19/2012 [facility] Patient Information 2015 [place]. This information is not intended to replace advice given to you by your health care provider. Make sure you discuss any questions you have with your health care provider.

## 2014-12-14 ENCOUNTER — Other Ambulatory Visit: Payer: Self-pay

## 2014-12-14 DIAGNOSIS — G8929 Other chronic pain: Secondary | ICD-10-CM

## 2014-12-14 DIAGNOSIS — M542 Cervicalgia: Principal | ICD-10-CM

## 2014-12-25 ENCOUNTER — Ambulatory Visit: Payer: No Typology Code available for payment source | Attending: Internal Medicine

## 2015-02-21 ENCOUNTER — Emergency Department (HOSPITAL_COMMUNITY)
Admission: EM | Admit: 2015-02-21 | Discharge: 2015-02-21 | Disposition: A | Discharge status: Home / Self Care | Payer: Self-pay | Attending: Emergency Medicine | Admitting: Emergency Medicine

## 2015-02-21 ENCOUNTER — Encounter (HOSPITAL_COMMUNITY): Payer: Self-pay

## 2015-02-21 DIAGNOSIS — Z791 Long term (current) use of non-steroidal anti-inflammatories (NSAID): Secondary | ICD-10-CM | POA: Insufficient documentation

## 2015-02-21 DIAGNOSIS — Z79899 Other long term (current) drug therapy: Secondary | ICD-10-CM | POA: Insufficient documentation

## 2015-02-21 DIAGNOSIS — Z880 Allergy status to penicillin: Secondary | ICD-10-CM | POA: Insufficient documentation

## 2015-02-21 DIAGNOSIS — M25571 Pain in right ankle and joints of right foot: Secondary | ICD-10-CM | POA: Insufficient documentation

## 2015-02-21 DIAGNOSIS — Z7951 Long term (current) use of inhaled steroids: Secondary | ICD-10-CM | POA: Insufficient documentation

## 2015-02-21 DIAGNOSIS — Z8709 Personal history of other diseases of the respiratory system: Secondary | ICD-10-CM | POA: Insufficient documentation

## 2015-02-21 MED ORDER — TRAMADOL HCL 50 MG PO TABS: 50.0000 mg | ORAL_TABLET | Freq: Four times a day (QID) | ORAL | Status: DC | PRN

## 2015-02-21 NOTE — Discharge Instructions (Signed)
Rest, Ice intermittently (in the first 24-48 hours), Gentle compression with an Ace wrap, and elevate (Limb above the level of the heart)  3 times a day (take with food) and acetaminophen 975mg  (this is 3 over the counter pills) four times a day. Do not drink alcohol or combine with other medications that have acetaminophen as an ingredient (Read the labels!).  For breakthrough pain you may take Tramadol. Do not drink alcohol drive or operate heavy machinery when taking Tramadol.  Please follow with your primary care doctor in the next 2 days for a check-up. They must obtain records for further management.   Do not hesitate to return to the Emergency Department for any new, worsening or concerning symptoms.

## 2015-02-21 NOTE — ED Notes (Signed)
Declined W/C at D/C and was escorted to lobby by RN. 

## 2015-02-21 NOTE — ED Notes (Signed)
Pt reports R foot pain x 3 months.  Pt's job requires her to stand all night.

## 2015-02-21 NOTE — ED Provider Notes (Signed)
CSN: [id number]     Arrival date & time 02/21/15  1008 History  This chart was scribed for a non-physician practitioner, Monico Blitz, PA-C working with Davonna Belling, MD by [place] Peace, ED Scribe. The patient was seen in TR05C/TR05C. The patient's care was started at 10:49 AM.    Chief Complaint  Patient presents with  . Foot Pain      Patient is a 54 y.o. female presenting with lower extremity pain. The history is provided by the patient. No language interpreter was used.  Foot Pain    HPI Comments: Melanie Durham is a 54 y.o. female who presents to the Emergency Department complaining of right foot pain x 3 months. Pain is specifically along sole of her foot, dorsal aspect of foot, and over the lateral aspect of her ankle. Pt notes she has been taking Ibuprofen 600mg  to address pain but adds that it "messes up" her stomach so she cannot take too much of it. Pt denies any recent traumas or injuries to foot but explains that she stands on her feet all day while at work. Her son states pt usually wears Nike running shoes while at work. Pt is non-smoker.    Past Medical History  Diagnosis Date  . Bronchitis    History reviewed. No pertinent past surgical history. Family History  Problem Relation Age of Onset  . Diabetes Father   . Heart disease Maternal Grandmother    History  Substance Use Topics  . Smoking status: Never Smoker   . Smokeless tobacco: Not on file  . Alcohol Use: No   OB History    No data available     Review of Systems  Musculoskeletal: Positive for arthralgias.       Right foot pain.   A complete 10 system review of systems was obtained and all systems are negative except as noted in the HPI and PMH.      Allergies  Penicillins  Home Medications   Prior to Admission medications   Medication Sig Start Date End Date Taking? Authorizing Provider  albuterol (PROVENTIL HFA;VENTOLIN HFA) 108 (90 BASE) MCG/ACT inhaler Inhale 2 puffs into  the lungs every 6 (six) hours as needed for wheezing or shortness of breath. 12/06/13   Janne Napoleon, NP  azithromycin (ZITHROMAX Z-PAK) 250 MG tablet Take as directed 08/14/14   Lorayne Marek, MD  azithromycin (ZITHROMAX Z-PAK) 250 MG tablet Take as directed 10/13/14   Lorayne Marek, MD  benzonatate (TESSALON) 100 MG capsule Take 1 capsule (100 mg total) by mouth 3 (three) times daily as needed for cough. 12/16/13   Lorayne Marek, MD  cetirizine (ZYRTEC) 10 MG tablet Take 1 tablet (10 mg total) by mouth daily. 05/15/14   Lorayne Marek, MD  chlorpheniramine-HYDROcodone (TUSSIONEX PENNKINETIC ER) 10-8 MG/5ML LQCR Take 2.5 ml to 5 ml every 12 hours prn cough. Will cause drowsiness. 12/06/13   Janne Napoleon, NP  diclofenac (CATAFLAM) 50 MG tablet Take 1 tablet (50 mg total) by mouth 3 (three) times daily. One tablet TID with food prn pain. 12/11/14   Janne Napoleon, NP  fluticasone (FLONASE) 50 MCG/ACT nasal spray Place 2 sprays into both nostrils daily. 12/16/13   Lorayne Marek, MD  omeprazole (PRILOSEC) 20 MG capsule Take 1 capsule (20 mg total) by mouth daily. 06/17/14   Lorayne Marek, MD  terbinafine (LAMISIL) 250 MG tablet Take 1 tablet (250 mg total) by mouth daily. 04/14/14   Debbe Odea, MD  Vitamin D, Ergocalciferol, (DRISDOL) 50000 UNITS CAPS  capsule Take 1 capsule (50,000 Units total) by mouth every 7 (seven) days. 12/17/13   Lorayne Marek, MD   BP 129/86 mmHg  Pulse 60  Temp(Src) 97.5 F (36.4 C) (Oral)  Resp 18  Wt 190 lb (86.183 kg)  SpO2 98% Physical Exam  Constitutional: She is oriented to person, place, and time. She appears well-developed and well-nourished. No distress.  HENT:  Head: Normocephalic and atraumatic.  Eyes: Conjunctivae and EOM are normal.  Neck: Neck supple. No tracheal deviation present.  Cardiovascular: Normal rate.   Pulmonary/Chest: Effort normal. No respiratory distress.  Musculoskeletal: Normal range of motion. She exhibits tenderness. She exhibits no edema.  Patient is  diffusely tender along the dorsum of the right foot, excellent range of motion to toes, dorsalis pedis is 2+. No overlying skin changes  Neurological: She is alert and oriented to person, place, and time.  Skin: Skin is warm and dry.  Psychiatric: She has a normal mood and affect. Her behavior is normal.  Nursing note and vitals reviewed.   ED Course  Procedures (including critical care time) Labs Review Labs Reviewed - No data to display  Imaging Review No results found.   EKG Interpretation None     Medications - No data to display  10:53 AM- Treatment plan was discussed with patient who verbalizes understanding and agrees.   MDM   Final diagnoses:  Arthralgia of right foot    Filed Vitals:   02/21/15 1035 02/21/15 1159  BP: 129/86 109/74  Pulse: 60 57  Temp: 97.5 F (36.4 C) 98 F (36.7 C)  TempSrc: Oral Oral  Resp: 18 14  Weight: 190 lb (86.183 kg)   SpO2: 98% 98%    Melanie Durham is a pleasant 54 y.o. female presenting with atraumatic right foot pain worsening over the course of one month. Physical exam with no acute abnormality, I don't think an x-ray will shed much light on the situation. Patient stands for her work. Will give podiatry referral and recommended more supportive shoes. Patient will be written for tramadol for pain control.  Evaluation does not show pathology that would require ongoing emergent intervention or inpatient treatment. Pt is hemodynamically stable and mentating appropriately. Discussed findings and plan with patient/guardian, who agrees with care plan. All questions answered. Return precautions discussed and outpatient follow up given.   Discharge Medication List as of 02/21/2015 10:58 AM    START taking these medications   Details  traMADol (ULTRAM) 50 MG tablet Take 1 tablet (50 mg total) by mouth every 6 (six) hours as needed., Starting 02/21/2015, Until Discontinued, Print         I personally performed the services  described in this documentation, which was scribed in my presence. The recorded information has been reviewed and is accurate.    Monico Blitz, PA-C 02/22/15 [place], MD 02/24/15 [phone]

## 2015-06-23 ENCOUNTER — Encounter: Payer: Self-pay | Admitting: *Deleted

## 2015-09-16 ENCOUNTER — Emergency Department (HOSPITAL_COMMUNITY)
Admission: EM | Admit: 2015-09-16 | Discharge: 2015-09-16 | Disposition: A | Discharge status: Home / Self Care | Payer: Self-pay | Attending: Emergency Medicine | Admitting: Emergency Medicine

## 2015-09-16 ENCOUNTER — Encounter (HOSPITAL_COMMUNITY): Payer: Self-pay | Admitting: Emergency Medicine

## 2015-09-16 ENCOUNTER — Emergency Department (HOSPITAL_COMMUNITY): Payer: Self-pay

## 2015-09-16 DIAGNOSIS — Z792 Long term (current) use of antibiotics: Secondary | ICD-10-CM | POA: Insufficient documentation

## 2015-09-16 DIAGNOSIS — S3991XA Unspecified injury of abdomen, initial encounter: Secondary | ICD-10-CM | POA: Insufficient documentation

## 2015-09-16 DIAGNOSIS — Z79899 Other long term (current) drug therapy: Secondary | ICD-10-CM | POA: Insufficient documentation

## 2015-09-16 DIAGNOSIS — Z7951 Long term (current) use of inhaled steroids: Secondary | ICD-10-CM | POA: Insufficient documentation

## 2015-09-16 DIAGNOSIS — S32000A Wedge compression fracture of unspecified lumbar vertebra, initial encounter for closed fracture: Secondary | ICD-10-CM

## 2015-09-16 DIAGNOSIS — Z880 Allergy status to penicillin: Secondary | ICD-10-CM | POA: Insufficient documentation

## 2015-09-16 DIAGNOSIS — Y9289 Other specified places as the place of occurrence of the external cause: Secondary | ICD-10-CM | POA: Insufficient documentation

## 2015-09-16 DIAGNOSIS — R202 Paresthesia of skin: Secondary | ICD-10-CM | POA: Insufficient documentation

## 2015-09-16 DIAGNOSIS — W109XXA Fall (on) (from) unspecified stairs and steps, initial encounter: Secondary | ICD-10-CM | POA: Insufficient documentation

## 2015-09-16 DIAGNOSIS — Y999 Unspecified external cause status: Secondary | ICD-10-CM | POA: Insufficient documentation

## 2015-09-16 DIAGNOSIS — S32010A Wedge compression fracture of first lumbar vertebra, initial encounter for closed fracture: Secondary | ICD-10-CM | POA: Insufficient documentation

## 2015-09-16 DIAGNOSIS — Y939 Activity, unspecified: Secondary | ICD-10-CM | POA: Insufficient documentation

## 2015-09-16 DIAGNOSIS — Z791 Long term (current) use of non-steroidal anti-inflammatories (NSAID): Secondary | ICD-10-CM | POA: Insufficient documentation

## 2015-09-16 DIAGNOSIS — Z8709 Personal history of other diseases of the respiratory system: Secondary | ICD-10-CM | POA: Insufficient documentation

## 2015-09-16 DIAGNOSIS — R109 Unspecified abdominal pain: Secondary | ICD-10-CM

## 2015-09-16 DIAGNOSIS — W19XXXA Unspecified fall, initial encounter: Secondary | ICD-10-CM

## 2015-09-16 HISTORY — DX: Unspecified asthma, uncomplicated: J45.909

## 2015-09-16 LAB — CBC WITH DIFFERENTIAL/PLATELET
BASOS ABS: 0 10*3/uL (ref 0.0–0.1)
BASOS PCT: 0 %
EOS ABS: 0.2 10*3/uL (ref 0.0–0.7)
EOS PCT: 2 %
HCT: 39.2 % (ref 36.0–46.0)
Hemoglobin: 13.2 g/dL (ref 12.0–15.0)
LYMPHS PCT: 30 %
Lymphs Abs: 1.9 10*3/uL (ref 0.7–4.0)
MCH: 30.3 pg (ref 26.0–34.0)
MCHC: 33.7 g/dL (ref 30.0–36.0)
MCV: 90.1 fL (ref 78.0–100.0)
MONO ABS: 0.4 10*3/uL (ref 0.1–1.0)
Monocytes Relative: 7 %
NEUTROS ABS: 3.9 10*3/uL (ref 1.7–7.7)
NEUTROS PCT: 61 %
PLATELETS: 141 10*3/uL — AB (ref 150–400)
RBC: 4.35 MIL/uL (ref 3.87–5.11)
RDW: 12.2 % (ref 11.5–15.5)
WBC: 6.4 10*3/uL (ref 4.0–10.5)

## 2015-09-16 LAB — COMPREHENSIVE METABOLIC PANEL
ALBUMIN: 3.8 g/dL (ref 3.5–5.0)
ALT: 59 U/L — ABNORMAL HIGH (ref 14–54)
AST: 44 U/L — AB (ref 15–41)
Alkaline Phosphatase: 81 U/L (ref 38–126)
Anion gap: 8 (ref 5–15)
BUN: 7 mg/dL (ref 6–20)
CO2: 29 mmol/L (ref 22–32)
Calcium: 9.6 mg/dL (ref 8.9–10.3)
Chloride: 104 mmol/L (ref 101–111)
Creatinine, Ser: 0.68 mg/dL (ref 0.44–1.00)
GFR calc Af Amer: >60 mL/min (ref >60)
GLUCOSE: 85 mg/dL (ref 65–99)
POTASSIUM: 3.7 mmol/L (ref 3.5–5.1)
Sodium: 141 mmol/L (ref 135–145)
Total Bilirubin: 0.6 mg/dL (ref 0.3–1.2)
Total Protein: 7.5 g/dL (ref 6.5–8.1)

## 2015-09-16 LAB — LIPASE, BLOOD: LIPASE: 28 U/L (ref 22–51)

## 2015-09-16 MED ORDER — SODIUM CHLORIDE 0.9 % IV BOLUS (SEPSIS)
1000.0000 mL | Freq: Once | INTRAVENOUS | Status: AC
  Administered 2015-09-16: 1000 mL via INTRAVENOUS

## 2015-09-16 MED ORDER — OXYCODONE-ACETAMINOPHEN 5-325 MG PO TABS
1.0000 | ORAL_TABLET | Freq: Once | ORAL | Status: AC
  Administered 2015-09-16: 1 via ORAL

## 2015-09-16 MED ORDER — OXYCODONE-ACETAMINOPHEN 5-325 MG PO TABS
ORAL_TABLET | ORAL | Status: AC
  Filled 2015-09-16: qty 1

## 2015-09-16 MED ORDER — TRAMADOL HCL 50 MG PO TABS: 50.0000 mg | ORAL_TABLET | Freq: Four times a day (QID) | ORAL | Status: DC | PRN

## 2015-09-16 MED ORDER — IOHEXOL 300 MG/ML  SOLN
100.0000 mL | Freq: Once | INTRAMUSCULAR | Status: AC | PRN
  Administered 2015-09-16: 100 mL via INTRAVENOUS

## 2015-09-16 MED ORDER — MORPHINE SULFATE (PF) 4 MG/ML IV SOLN
4.0000 mg | Freq: Once | INTRAVENOUS | Status: AC
Start: 2015-09-16 — End: 2015-09-16
  Administered 2015-09-16: 4 mg via INTRAVENOUS
  Filled 2015-09-16: qty 1

## 2015-09-16 MED ORDER — ONDANSETRON HCL 4 MG/2ML IJ SOLN
4.0000 mg | Freq: Once | INTRAMUSCULAR | Status: AC
  Administered 2015-09-16: 4 mg via INTRAVENOUS
  Filled 2015-09-16: qty 2

## 2015-09-16 NOTE — Progress Notes (Signed)
[place] [facility] [phone]  Spoke to patient and family at bedside regarding primary care resources and the [facility] orange card. 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 Patient informed that she would be considered a new patient at the clinic and clinic is currently not accepting new patients. Spanish application provided, patient made aware of practices that are accepting new patients. Patient instructed to contact me once application is complete for an enrollment appointment. My contact information given for any future questions or concerns. No other [place] Specialist needs identified at this time.

## 2015-09-16 NOTE — ED Notes (Signed)
Pt reports falling down two steps this AM walking out of her house because of mechanical fall. Pt able to ambulate since fall this AM. Reports pain to lower back. Pt able to move all extremites.

## 2015-09-16 NOTE — ED Notes (Signed)
Social Worker contacted for assistance for appt at clinic.

## 2015-09-16 NOTE — ED Provider Notes (Signed)
6:27 PM Assumed care from Dr. Lacinda Axon, please see their note for full history, physical and decision making until this point. In brief this is a 54 y.o. year old female who presented to the ED tonight with Back Pain     Patient had fallen down and came in with  back pain. Found to have an L1 fracture of unknown age. But also endorsed some abdominal pain so pending CT scan at time of my assumption of care.  CT scan negative for any acute intra-abdominal pathology. Previously recognized L1 fracture appears to be old. We'll give her prescription for Ultram for the back pain.  I have personally and contemperaneously reviewed labs and imaging and used in my decision making as above.   A medical screening exam was performed and I feel the patient has had an appropriate workup for their chief complaint at this time and likelihood of emergent condition existing is low. They have been counseled on decision, discharge, follow up and which symptoms necessitate immediate return to the emergency department. They or their family verbally stated understanding and agreement with plan and discharged in stable condition.    Labs, studies and imaging reviewed by myself and considered in medical decision making if ordered. Imaging interpreted by radiology.   Labs Reviewed  CBC WITH DIFFERENTIAL/PLATELET - Abnormal; Notable for the following:    Platelets 141 (*)    All other components within normal limits  COMPREHENSIVE METABOLIC PANEL - Abnormal; Notable for the following:    AST 44 (*)    ALT 59 (*)    All other components within normal limits  LIPASE, BLOOD      Merrily Pew, MD 09/16/15 2006

## 2015-09-16 NOTE — ED Provider Notes (Signed)
CSN: [id number]     Arrival date & time 09/16/15  1154 History   By signing my name below, I, Erling Conte, attest that this documentation has been prepared under the direction and in the presence of Merrily Pew, MD. Electronically Signed: Erling Conte, ED Scribe. 09/16/2015. 4:24 PM.     Chief Complaint  Patient presents with  . Back Pain   The history is provided by the patient. No language interpreter was used.    HPI Comments: Melanie Durham is a 54 y.o. female who presents to the Emergency Department complaining of  constant, moderate low back pain s/p fall that occurred down 3 steps onset 2 hours ago. She reports when she fell she fell onto the concrete and landed on her back from a standing position. She states she has associated tingling in her left leg that radiates from her back. Pt endorses the pain is exacerbated with any position changes. She states she was given Percocet upon arrival to the ER which has provided relief for her pain. She is ambulatory with out difficulty. She denies any LOC or head injury from the fall. She denies any h/o back issues. She denies any urinary/bowel incontinence, difficulty urinating, numbness, weakness or saddles anesthesia.   Past Medical History  Diagnosis Date  . Bronchitis    History reviewed. No pertinent past surgical history. Family History  Problem Relation Age of Onset  . Diabetes Father   . Heart disease Maternal Grandmother    Social History  Substance Use Topics  . Smoking status: Never Smoker   . Smokeless tobacco: None  . Alcohol Use: No   OB History    No data available     Review of Systems    Allergies  Penicillins  Home Medications   Prior to Admission medications   Medication Sig Start Date End Date Taking? Authorizing Provider  albuterol (PROVENTIL HFA;VENTOLIN HFA) 108 (90 BASE) MCG/ACT inhaler Inhale 2 puffs into the lungs every 6 (six) hours as needed for wheezing or shortness of breath.  12/06/13   Janne Napoleon, NP  azithromycin (ZITHROMAX Z-PAK) 250 MG tablet Take as directed 08/14/14   Lorayne Marek, MD  azithromycin (ZITHROMAX Z-PAK) 250 MG tablet Take as directed 10/13/14   Lorayne Marek, MD  benzonatate (TESSALON) 100 MG capsule Take 1 capsule (100 mg total) by mouth 3 (three) times daily as needed for cough. 12/16/13   Lorayne Marek, MD  cetirizine (ZYRTEC) 10 MG tablet Take 1 tablet (10 mg total) by mouth daily. 05/15/14   Lorayne Marek, MD  chlorpheniramine-HYDROcodone (TUSSIONEX PENNKINETIC ER) 10-8 MG/5ML LQCR Take 2.5 ml to 5 ml every 12 hours prn cough. Will cause drowsiness. 12/06/13   Janne Napoleon, NP  diclofenac (CATAFLAM) 50 MG tablet Take 1 tablet (50 mg total) by mouth 3 (three) times daily. One tablet TID with food prn pain. 12/11/14   Janne Napoleon, NP  fluticasone (FLONASE) 50 MCG/ACT nasal spray Place 2 sprays into both nostrils daily. 12/16/13   Lorayne Marek, MD  omeprazole (PRILOSEC) 20 MG capsule Take 1 capsule (20 mg total) by mouth daily. 06/17/14   Lorayne Marek, MD  terbinafine (LAMISIL) 250 MG tablet Take 1 tablet (250 mg total) by mouth daily. 04/14/14   Debbe Odea, MD  traMADol (ULTRAM) 50 MG tablet Take 1 tablet (50 mg total) by mouth every 6 (six) hours as needed. 02/21/15   Nicole Pisciotta, PA-C  Vitamin D, Ergocalciferol, (DRISDOL) 50000 UNITS CAPS capsule Take 1 capsule (50,000 Units total) by  mouth every 7 (seven) days. 12/17/13   Lorayne Marek, MD   Triage Vitals: BP 126/81 mmHg  Pulse 74  Temp(Src) 98.1 F (36.7 C) (Oral)  Resp 20  SpO2 99%  Physical Exam  Constitutional: She is oriented to person, place, and time. She appears well-developed and well-nourished.  HENT:  Head: Normocephalic and atraumatic.  Eyes: Conjunctivae and EOM are normal. Pupils are equal, round, and reactive to light.  Neck: Normal range of motion. Neck supple.  Abdominal: Soft. There is no tenderness.  Musculoskeletal: Normal range of motion. She exhibits tenderness.  Tender to  L2-L4 Pain with SLR on left, no pain with right SLR  Neurological: She is alert and oriented to person, place, and time. She has normal strength. No sensory deficit.  Skin: Skin is warm and dry.  Psychiatric: She has a normal mood and affect. Her behavior is normal.  Nursing note and vitals reviewed.   ED Course  Procedures (including critical care time)  DIAGNOSTIC STUDIES: Oxygen Saturation is 99% on RA, normal by my interpretation.    COORDINATION OF CARE: 1:36 PM- Will order imaging of lumbar spine.  Pt advised of plan for treatment and pt agrees.   Labs Review Labs Reviewed  CBC WITH DIFFERENTIAL/PLATELET  COMPREHENSIVE METABOLIC PANEL  LIPASE, BLOOD    Imaging Review Dg Lumbar Spine Complete  09/16/2015   CLINICAL DATA:  Lumbago with leg weakness after fall down steps  EXAM: LUMBAR SPINE - COMPLETE 4+ VIEW  COMPARISON:  None.  FINDINGS: Frontal, lateral, spot lumbosacral lateral, and bilateral oblique views were obtained. There are 5 non-rib-bearing lumbar type vertebral bodies. There is slight anterior wedging of the L1 vertebral body, age uncertain. No other fracture. No spondylolisthesis. Disc spaces appear intact peer there is mild facet osteoarthritic change at L5-S1 bilaterally.  IMPRESSION: Slight anterior wedging of the L1 vertebral body, age uncertain. No other evidence of fracture. No spondylolisthesis. 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   Electronically Signed   By: Lowella Grip III M.D.   On: 09/16/2015 14:27   I have personally reviewed and evaluated these images as part of my medical decision-making.   EKG Interpretation None     Plain films of lumbar spine reveal a slight anterior wedging of L1 vertebral body. Patient complains of persistent left anterior abdominal pain. Will order contrasted CT scan abdomen/pelvis.  Disc c Dr Dayna Barker MDM   Final diagnoses:  Lumbar compression fracture, closed, initial encounter ([place])  Abdominal  pain, unspecified abdominal location  Fall, initial encounter  Status post fall down several steps. Plain films of lumbar spine reveal a slight anterior wedging of L1 vertebral body. Patient complains of persistent abdominal pain. Will obtain CT abdomen/pelvis with contrast. Discussed with patient, her family, Dr. Esmond Camper, [facility], personally performed the services described in this documentation. All medical record entries made by the scribe were at my direction and in my presence.  I have reviewed the chart and discharge instructions and agree that the record reflects my personal performance and is accurate and complete. Aubreyanna Dorrough.  09/16/2015. 4:25 PM.     Nat Christen, MD 09/16/15 1626

## 2015-09-16 NOTE — Care Management Note (Signed)
Case Management Note  Patient Details  Name: Melanie Durham MRN: [id number] Date of Birth: 1961-01-13  Subjective/Objective:                  54 y.o. female who presents to the Emergency Department complaining of constant, moderate low back pain s/p fall that occurred down 3 steps.//Home with family  Action/Plan: Follow for disposition needs.   Expected Discharge Date:   09/16/15               Expected Discharge Plan:  Home/Self Care  In-House Referral:  Interpreting Services  Discharge planning Services  CM Consult, Follow-up appt scheduled  Post Acute Care Choice:  NA Choice offered to:  NA  DME Arranged:  N/A DME Agency:  NA  HH Arranged:  NA HH Agency:  NA  Status of Service:  Completed, signed off  Medicare Important Message Given:    Date Medicare IM Given:    Medicare IM give by:    Date Additional Medicare IM Given:    Additional Medicare Important Message give by:     If discussed at [place] of Stay Meetings, dates discussed:    Additional Comments: Darrelle Wiberg J. Clydene Laming, RN, BSN, [place] [phone] ED CM consulted regarding PCP establishment and insurance enrollment. Pt presented to [facility] ED today with back pain. NCM met with pt at bedside; pt confirms not having access to f/u care with PCP or insurance coverage. Discussed with patient importance and benefits of establishing PCP, and not utilizing the ED for primary care needs. Pt verbalized understanding and is in agreement. Discussed other options, provided list of local  affordable PCPs.  Pt voiced interest in the [facility] and [place].  NCM advised that [facility]  Internal Medicine providers are seeing pts at [facility]. Pt verbalized understanding. NCM set up appointment with Sharon Seller, NP 10/31 at 2:15.  Fuller Mandril, RN 09/16/2015, 3:09 PM

## 2015-10-11 ENCOUNTER — Encounter: Payer: Self-pay | Admitting: Family Medicine

## 2015-10-11 ENCOUNTER — Ambulatory Visit (INDEPENDENT_AMBULATORY_CARE_PROVIDER_SITE_OTHER): Payer: Self-pay | Admitting: Family Medicine

## 2015-10-11 VITALS — BP 121/77 | HR 68 | Temp 98.2°F | Resp 16 | Wt 186.0 lb

## 2015-10-11 DIAGNOSIS — M549 Dorsalgia, unspecified: Secondary | ICD-10-CM

## 2015-10-11 NOTE — Progress Notes (Signed)
Patient ID: Melanie Durham, female   DOB: 06/11/1961, 54 y.o.   MRN: [id number] Patient presents here for chronic pain but has an orange card for Dr. Amil Amen at [facility]. Was not seen

## 2015-10-22 ENCOUNTER — Ambulatory Visit (INDEPENDENT_AMBULATORY_CARE_PROVIDER_SITE_OTHER): Payer: Self-pay | Admitting: Internal Medicine

## 2015-10-22 ENCOUNTER — Encounter: Payer: Self-pay | Admitting: Internal Medicine

## 2015-10-22 VITALS — BP 132/86 | HR 78 | Ht 64.0 in | Wt 186.0 lb

## 2015-10-22 DIAGNOSIS — Z23 Encounter for immunization: Secondary | ICD-10-CM

## 2015-10-22 DIAGNOSIS — F32A Depression, unspecified: Secondary | ICD-10-CM

## 2015-10-22 DIAGNOSIS — M545 Low back pain, unspecified: Secondary | ICD-10-CM

## 2015-10-22 DIAGNOSIS — K219 Gastro-esophageal reflux disease without esophagitis: Secondary | ICD-10-CM

## 2015-10-22 DIAGNOSIS — F329 Major depressive disorder, single episode, unspecified: Secondary | ICD-10-CM

## 2015-10-22 MED ORDER — OMEPRAZOLE 20 MG PO CPDR: 20.0000 mg | DELAYED_RELEASE_CAPSULE | Freq: Every day | ORAL | Status: DC

## 2015-10-22 MED ORDER — GABAPENTIN 300 MG PO CAPS: 300.0000 mg | ORAL_CAPSULE | Freq: Three times a day (TID) | ORAL | Status: DC

## 2015-10-22 NOTE — Progress Notes (Signed)
   Subjective:    Patient ID: Melanie Durham, female    DOB: Feb 18, 1961, 54 y.o.   MRN: [id number]  HPI  1.  Bilateral low back pain:  States Oct. 6th, turned from checking her side door of house and fell backward off second step, first landing on her right buttock/thigh and then flat on her back.  Head hit as well, but was cushioned by her hair bun.  Felt like she was cut in half around her low back, radiating to her bilateral anterior pelvic area.  Laid there for 10 minutes until her daughter came to pick her up for a job interview.  Pt. Denies numbness, tingling or weakness of legs.   Went to ED (in their report, she complained of left leg pain and weakness) where xray showed slight anterior wedging of the L1 vertebral body, age uncertain.  CT scan of abdomen done due to abominal pain as well, which did not show any acute injury, including the changes at L1, which appeared chronic.   Looked at T11 and T12 on xrays and do not note abnormality there on the films done in October.  LOts of stool on xray.  Pt. Intermittently with constipation. 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   Pt. Denies any history of chronic left pelvic discomfort.  Current bilateral abdominal pelvic pain started with back injury after fall.  2.  GERD:  Has been out of Omeprazole for 6 months.  Having sense of food getting stuck above epigastrium.  Some epigastric pain.  No melena or hemotachezia.  No weight loss. Has used NSAIDS here and there for back pain.  HOB not elevated  3.  Depressed.  Does not feel useful.  Tearful at times. Anxiety at times as well.  PHQ 9 scored 11.  Not suicidal.  Does not want to consider medication at this time.     Review of Systems     Objective:   Physical Exam NAD, but obvious discomfort moving from chair to exam table. Lungs:  CTA CV:  RRR without murmur or rub Abd:  S, Mild midepigastric and LUQ tenderness, Tender over bilateral  lower quadrants.  No HSM or mass.  +BS Back:  Point tenderness at about what would expect to be T12 or L1. Spinous processes.  Mild tenderness 2 levels below this, but not above.  NT over paraspinous musculature.  Mildly positive right straight leg raise.  Negative on Left.   Neuro:  MOtor 5/5, DTRs 2+/5 of LE.  Normal sensation to abdominal wall and LE.       Assessment & Plan:  1.  Low back/low thoracic pain with possible radicular bilateral pelvic pain.  Start Neurontin just at night.  Discussed may also help with sleep.  Sleep only on sides.  Discussed not bending over, but to use legs to get to things down low.  No heavy lifting. Return in 1 month. Consider Diclofenac once GERD under control.   2.  GERD with dysphagia:  Restart Omeprazole. Both meds sent to [place]  3. Depression:  Referral to Metta Clines for counseling. No medication for now.  4.  HM:  Tdap today.  REcommended flu vaccine gratis next Thursday at orange card sign up.

## 2015-10-22 NOTE — Patient Instructions (Addendum)
Keep moving!  Use legs and don't bend over from waist. Use Metamucil in large glass water daily. Drink 8 glasses of water daily and eat fresh fruits and veggies daily --5 servings, to keep your stool regular   .Enfermedad por reflujo gastroesofgico en los adultos (Gastroesophageal Reflux Disease, Adult) Normalmente, los alimentos descienden por el esfago y se depositan en el estmago para su digestin. Sin embargo, cuando una persona tiene enfermedad por reflujo gastroesofgico (ERGE), los alimentos y el cido estomacal regresan al esfago. Cuando esto ocurre, el esfago se irrita y se inflama. Con el tiempo, la ERGE puede provocar la formacin de pequeas perforaciones (lceras) en la mucosa del esfago.  CAUSAS Un problema del msculo que se encuentra entre el esfago y Product manager (esfnter esofgico inferior o EEI) es la causa de esta enfermedad. Por lo general, el esfnter esofgico inferior se cierra despus de que los alimentos pasan a travs del esfago hacia el [place]. Cuando el EEI est debilitado o no es normal, no se cierra correctamente, lo que permite el paso retrgrado de los alimentos y el cido estomacal al esfago. Algunas sustancias de la dieta, algunos medicamentos y Arboriculturist enfermedades pueden debilitar este esfnter, entre ellos:  Consumo de tabaco.  [place].  Hernia de hiato.  Consumo excesivo de alcohol.  Algunos alimentos y ciertas bebidas, como el caf, el chocolate, las cebollas y Production manager. FACTORES DE RIESGO Es ms probable que esta afeccin se manifieste en:  Los personas con sobrepeso.  Las personas con trastornos del tejido conjuntivo.  Las personas que toman antiinflamatorios no esteroides (AINE). SNTOMAS Los sntomas de esta afeccin incluyen lo siguiente:  Merchant navy officer.  Dificultad o dolor al tragar.  Sensacin de Best boy un bulto en la garganta.  Sabor amargo en la boca.  Mal aliento.  Gran cantidad de saliva.  Malestar estomacal o  meteorismo.  Flatulencias.  Dolor en el pecho.  Falta de aire o sibilancias.  Tos permanente (crnica) o tos nocturna.  Desgaste el esmalte dental.  Prdida de peso. El dolor en el pecho puede deberse a muchas afecciones diferentes. Consulte al mdico si tiene Tourist information centre manager. DIAGNSTICO El mdico le har una historia clnica y un examen fsico. Para determinar si la ERGE es leve o grave, el mdico tambin puede controlar la respuesta al [place]. Tambin pueden hacerle otros estudios, por ejemplo:  Una endoscopia para examinar el estmago y el esfago con Ardelia Mems pequea cmara.  Un estudio que determina el nivel de acidez en el esfago.  Un estudio que mide la presin que hay en el esfago.  Un estudio de deglucin de bario o un estudio modificado de deglucin de bario para mostrar la forma, el tamao y el funcionamiento del esfago. [place] es aliviar los sntomas y Product/process development scientist las complicaciones. El tratamiento de esta afeccin puede variar en funcin de la gravedad de los sntomas. El mdico podr indicar lo siguiente:  Cambios en la dieta.  Medicamentos.  Ciruga. INSTRUCCIONES PARA EL CUIDADO EN EL HOGAR Dieta  Siga la dieta que le haya recomendado el mdico, la cual puede incluir evitar alimentos y bebidas tales como:  Caf y t (con o sin cafena).  Bebidas que contengan alcohol.  Bebidas energizantes y deportivas.  Gaseosas o refrescos.  Chocolate y cacao.  Menta y [place].  Ajo y cebollas.  Rbano picante.  Alimentos muy condimentados y cidos, entre ellos, pimientos, [place] en polvo, curry en polvo, vinagre, salsas picantes y salsa barbacoa.  Frutas ctricas y sus jugos, como naranjas, limones y limas.  Alimentos a base de tomates, como salsa roja, [place], salsa y pizza con salsa roja.  Alimentos fritos y Radio broadcast assistant, como rosquillas, papas fritas y aderezos con alto contenido de Lobbyist.  Carnes con alto contenido  de [place], como hot dogs y cortes grasos de carnes rojas y blancas, por ejemplo, filetes de entrecot, salchicha, jamn y tocino.  Productos lcteos con alto contenido de [place], como [place], [place] y queso crema.  Haga comidas pequeas y frecuentes Medical sales representative de comidas abundantes.  Evite beber [place] comidas.  No coma durante las 2 o 3horas previas a la hora de [place].  No se acueste inmediatamente despus de comer.  No haga actividad fsica enseguida despus de comer. Instrucciones generales  Est atento a cualquier cambio en los sntomas.  Tome los medicamentos de venta libre y los recetados solamente como se lo haya indicado el mdico. No tome aspirina, ibuprofeno ni otros antiinflamatorios no esteroides (AINE), a menos que se lo haya indicado el mdico.  No consuma ningn producto que contenga tabaco, lo que incluye cigarrillos, tabaco de Higher education careers adviser y Psychologist, sport and exercise. Si necesita ayuda para dejar de fumar, consulte al mdico.  Use ropas sueltas. No use prendas ajustadas alrededor de la cintura que ejerzan presin en el abdomen.  Levante (eleve) 6pulgadas (15centmetros) la cabecera de la cama.  Trate de reducir [employer] de estrs con actividades como el yoga o la meditacin. Si necesita ayuda para reducir [employer] de estrs, consulte al mdico.  Si tiene sobrepeso, Multimedia programmer un peso saludable. Hable con el mdico acerca de su peso ideal y pdale asesoramiento en cuanto a la dieta que debe seguir para Therapist, music.  Concurra a todas las visitas de control como se lo haya indicado el mdico. Esto es importante. SOLICITE ATENCIN MDICA SI:  Aparecen nuevos sntomas.  Baja de peso sin causa aparente.  Tiene dificultad para tragar o siente dolor al [employer].  Tiene sibilancias o tos persistente.  Los sntomas no mejoran con Dispensing optician.  Tiene la voz ronca. [place] [employer]  SI:  Tiene dolor en los brazos, el cuello, los [place], la dentadura o la espalda.  Philbert Riser, se marea o tiene sensacin de desvanecimiento.  Siente falta de aire o Tourist information centre manager.  Vomita y el vmito es parecido a la sangre o a los granos de caf.  Se desmaya.  Las heces son sanguinolentas o de color negro.  No puede tragar, beber o comer.   Esta informacin no tiene Marine scientist el consejo del mdico. Asegrese de hacerle al mdico cualquier pregunta que tenga.   Document Released: 09/06/2005 Document Revised: 08/18/2015 Elsevier Interactive Patient Education [employer].

## 2015-11-19 ENCOUNTER — Ambulatory Visit (INDEPENDENT_AMBULATORY_CARE_PROVIDER_SITE_OTHER): Payer: Self-pay | Admitting: Internal Medicine

## 2015-11-19 ENCOUNTER — Encounter: Payer: Self-pay | Admitting: Internal Medicine

## 2015-11-19 VITALS — BP 120/70 | HR 78 | Temp 98.3°F | Resp 20 | Ht 64.0 in | Wt 190.0 lb

## 2015-11-19 DIAGNOSIS — J039 Acute tonsillitis, unspecified: Secondary | ICD-10-CM

## 2015-11-19 DIAGNOSIS — L299 Pruritus, unspecified: Secondary | ICD-10-CM

## 2015-11-19 DIAGNOSIS — J069 Acute upper respiratory infection, unspecified: Secondary | ICD-10-CM

## 2015-11-19 DIAGNOSIS — M545 Low back pain, unspecified: Secondary | ICD-10-CM

## 2015-11-19 DIAGNOSIS — K219 Gastro-esophageal reflux disease without esophagitis: Secondary | ICD-10-CM

## 2015-11-19 LAB — POCT RAPID STREP A (OFFICE): Rapid Strep A Screen: NEGATIVE

## 2015-11-19 MED ORDER — MOMETASONE FUROATE 50 MCG/ACT NA SUSP: NASAL | Status: DC

## 2015-11-19 MED ORDER — CETIRIZINE HCL 10 MG PO TABS: 10.0000 mg | ORAL_TABLET | Freq: Every day | ORAL | Status: DC

## 2015-11-19 NOTE — Progress Notes (Signed)
   Subjective:    Patient ID: Melanie Durham, female    DOB: [birth year], 54 y.o.   MRN: [id number]  HPI 1.Back Pain:  Taking Gabapentin 300 mg three times daily.  Feels sleepy soon after taking medicine.  Has been just automatically going to bed after eating and taking med.  Does not wait 2 hours.  Had actually meant to order Gabapentin only in the evening, but automatic computer fill of the instructions and pt. Received  The three times daily dosing.  Pain is much improved.  Has some discomfort in the morning, but when gets up and moves around, feels much better.  2.  GERD:  Dysphagia resolved.  Feels better.  Not taking the medication at the same time daily.  3.  Depression:  Has missed calls to counsel with Metta Clines, LCSW.  No appointment with her yet.  Previously, felt she is not useful and has anxiety.  PHQ score of 11 at last visit.  4.  Cough:  Started about 1 1/2 weeks ago.  Had a sore throat as well. Did feel feverish the first day.  No nausea, but did vomit with significant coughing.  The latter symptoms are resolved, but continues with cough, though that is improving as well.  Is not using allergy medicines--state they make her sleepy as well.  Has been using Oxymetolazone nasal spray for over a week for the nasal congestion.  Using really only at night.       Review of Systems     Objective:   Physical Exam  NAD, intermittent cough HEENT:  PERRL, EOMI, TMs pearly gray  Ulcers and vesicles with red bases on soft palate--mainly right Neck:  Supple, no adenopathy Chest: CTA CV:  RRR without murmur or rub Back:  Moves easily today.  Mild tenderness of bilateral lower lumbar paraspinous musculature        Assessment & Plan:  1.  Low back pain:  Much improved.  Discussed weaning the Gabapentin by the midday dose, wait several days and if pain remains controlled, to wean the morning dose.  If pain recurs, to restart the morning dose.  Stay on the evening  dose.  2.  Depression--warm hand off to [employer], LCSW  3.  GERD:  Resolved on restart of PPI.  To not lie down for 2 hours after eating.  4.  Cough:  URI and pharyngitis likely from Coxsackie like virus.  Supportive care.  STrep negative.  REstart allergy medications--switch to brands covered at [place]

## 2015-11-19 NOTE — Patient Instructions (Signed)
Stop Oxymetolazone nasal spray  Use Nasonex y Cetirizine todo los dias cuando tiene tos.  Wean Gabapentin by stopping the middle of day dose first.  If no increase in back pain after 3 days, stop the morning dose and just stay on the evening dose. If your pain increases after stopping one of the doses, add that dose back  Tome Omeprazole en la noche

## 2015-11-25 ENCOUNTER — Other Ambulatory Visit: Payer: No Typology Code available for payment source | Admitting: Licensed Clinical Social Worker

## 2015-12-01 ENCOUNTER — Other Ambulatory Visit (INDEPENDENT_AMBULATORY_CARE_PROVIDER_SITE_OTHER): Payer: Self-pay | Admitting: Licensed Clinical Social Worker

## 2015-12-01 DIAGNOSIS — F32A Depression, unspecified: Secondary | ICD-10-CM

## 2015-12-01 DIAGNOSIS — F329 Major depressive disorder, single episode, unspecified: Secondary | ICD-10-CM
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
## 2015-12-16 ENCOUNTER — Other Ambulatory Visit (INDEPENDENT_AMBULATORY_CARE_PROVIDER_SITE_OTHER): Payer: Self-pay | Admitting: Licensed Clinical Social Worker

## 2015-12-16 DIAGNOSIS — F4310 Post-traumatic stress disorder, unspecified: Secondary | ICD-10-CM
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
## 2015-12-24 ENCOUNTER — Other Ambulatory Visit (INDEPENDENT_AMBULATORY_CARE_PROVIDER_SITE_OTHER): Payer: Self-pay | Admitting: Licensed Clinical Social Worker

## 2015-12-24 DIAGNOSIS — F4310 Post-traumatic stress disorder, unspecified: Secondary | ICD-10-CM
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
## 2016-01-06 ENCOUNTER — Other Ambulatory Visit: Payer: No Typology Code available for payment source | Admitting: Licensed Clinical Social Worker

## 2016-01-13 ENCOUNTER — Other Ambulatory Visit: Payer: No Typology Code available for payment source | Admitting: Licensed Clinical Social Worker

## 2016-02-03 ENCOUNTER — Other Ambulatory Visit (INDEPENDENT_AMBULATORY_CARE_PROVIDER_SITE_OTHER): Payer: Self-pay | Admitting: Licensed Clinical Social Worker

## 2016-02-03 DIAGNOSIS — F4310 Post-traumatic stress disorder, unspecified: Secondary | ICD-10-CM
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
## 2016-02-17 ENCOUNTER — Ambulatory Visit: Payer: No Typology Code available for payment source | Admitting: Internal Medicine

## 2016-03-23 ENCOUNTER — Ambulatory Visit (INDEPENDENT_AMBULATORY_CARE_PROVIDER_SITE_OTHER): Payer: Self-pay | Admitting: Internal Medicine

## 2016-03-23 ENCOUNTER — Encounter: Payer: Self-pay | Admitting: Internal Medicine

## 2016-03-23 VITALS — BP 120/90 | HR 60 | Resp 18 | Ht 64.0 in | Wt 191.0 lb

## 2016-03-23 DIAGNOSIS — K219 Gastro-esophageal reflux disease without esophagitis: Secondary | ICD-10-CM

## 2016-03-23 DIAGNOSIS — M545 Low back pain: Secondary | ICD-10-CM

## 2016-03-23 MED ORDER — OMEPRAZOLE 20 MG PO CPDR: 20.0000 mg | DELAYED_RELEASE_CAPSULE | Freq: Every day | ORAL | Status: DC

## 2016-03-23 MED ORDER — GABAPENTIN 100 MG PO CAPS: ORAL_CAPSULE | ORAL | Status: DC

## 2016-03-23 NOTE — Progress Notes (Signed)
   Subjective:    Patient ID: Melanie Durham, female    DOB: Jun 06, 1961, 55 y.o.   MRN: [id number]  HPI   1.  Back Pain:  Not taking Gabapentin daily.  Stopped taking it as was not having pain after weaning to 1 cap at bedtime.  Also, was on 300 mg caps and was having sleepiness until midday when taking Gabapentin only at bedtime.   She did wean off the daytime doses as discussed in December visit.  2. Depression:  Did have counseling sessions with Macie Burows, LCSW.  Feels it was helpful.  Cannot get to sessions currently due to work schedule.     3.  GERD:  Taking Omeprazole daily and symptoms are resolved.  4. Fingernails turning black:  Has noted for 5 years.  Have worsened with black coloration over time.  Has nails painted today and cannot assess nails   Current outpatient prescriptions:  .  omeprazole (PRILOSEC) 20 MG capsule, Take 1 capsule (20 mg total) by mouth daily., Disp: 30 capsule, Rfl: 3 .  cetirizine (ZYRTEC) 10 MG tablet, Take 1 tablet (10 mg total) by mouth daily. (Patient not taking: Reported on 03/23/2016), Disp: 30 tablet, Rfl: 11 .  gabapentin (NEURONTIN) 300 MG capsule, Take 1 capsule (300 mg total) by mouth 3 (three) times daily. (Patient not taking: Reported on 03/23/2016), Disp: 90 capsule, Rfl: 3 .  mometasone (NASONEX) 50 MCG/ACT nasal spray, 2 sprays each nostril once daily (Patient not taking: Reported on 03/23/2016), Disp: 17 g, Rfl: 11   Allergies  Allergen Reactions  . Penicillins     Blurred vision and unable to hear--took once when a young girl   Review of Systems     Objective:   Physical Exam   NAD Lungs:  CTA CV:  RRR without murmur or rub, radial pulses normal and equal Abd: S, NT, No HSM or mass, +BS Fingernails and toenails coated with pink polish and unable to assess nails adequately. Back:  NT over spinous processes and paraspinous musculature.        Assessment & Plan:  1.  Back Pain:  Decrease Gabapentin to 100 mg at bedtime.   Discussed gabapentin does not work as an acute pain reliever.  She will need to take a lower dose at bedtime and hopefully no morning hang over of med.  2.  GERD:  Omeprazole refilled for 1 year, but discussed eating less and getting more phyiscally active to lose weight so perhaps GERD symptoms improved  3.  Nail coloration concerns:  To return without nail polish for adequate evaluation.

## 2016-03-23 NOTE — Patient Instructions (Signed)
No pinctura de unas en cita futuro

## 2016-06-22 ENCOUNTER — Encounter: Payer: Self-pay | Admitting: Internal Medicine

## 2016-06-22 ENCOUNTER — Other Ambulatory Visit: Payer: Self-pay | Admitting: Internal Medicine

## 2016-06-22 ENCOUNTER — Ambulatory Visit (INDEPENDENT_AMBULATORY_CARE_PROVIDER_SITE_OTHER): Payer: Self-pay | Admitting: Internal Medicine

## 2016-06-22 VITALS — BP 124/80 | HR 68 | Resp 16 | Ht 64.0 in | Wt 188.0 lb

## 2016-06-22 DIAGNOSIS — Z0000 Encounter for general adult medical examination without abnormal findings: Secondary | ICD-10-CM

## 2016-06-22 DIAGNOSIS — Z1239 Encounter for other screening for malignant neoplasm of breast: Secondary | ICD-10-CM

## 2016-06-22 DIAGNOSIS — H547 Unspecified visual loss: Secondary | ICD-10-CM

## 2016-06-22 DIAGNOSIS — Z124 Encounter for screening for malignant neoplasm of cervix: Secondary | ICD-10-CM

## 2016-06-22 LAB — POCT WET PREP WITH KOH
Clue Cells Wet Prep HPF POC: NEGATIVE
KOH PREP POC: NEGATIVE
RBC Wet Prep HPF POC: NEGATIVE
TRICHOMONAS UA: NEGATIVE
YEAST WET PREP PER HPF POC: NEGATIVE

## 2016-06-22 NOTE — Patient Instructions (Signed)
Return stool cards when come back for fasting labs

## 2016-06-22 NOTE — Progress Notes (Signed)
Subjective:    Patient ID: Melanie Durham, female    DOB: [birth year], 55 y.o.   MRN: [id number]  HPI  Here for CPE with pap.  Health Maintenance:  1.  Last pap:  Possibly last was in 2010.  Always normal  2.  Last mammogram:  07/2009:  Only time she has had--normal  3.  SBE:  No  4.  Guaiac Cards:  Never  5.  Colonoscopy:  Never  Immunization History  Administered Date(s) Administered  . Influenza,inj,Quad PF,36+ Mos 08/14/2014  . Tdap 10/22/2015   LMP:  2015   Current outpatient prescriptions:  .  gabapentin (NEURONTIN) 100 MG capsule, 1 capsule by mouth daily at bedtime, Disp: 30 capsule, Rfl: 11 .  omeprazole (PRILOSEC) 20 MG capsule, Take 1 capsule (20 mg total) by mouth daily., Disp: 30 capsule, Rfl: 3 .  cetirizine (ZYRTEC) 10 MG tablet, Take 1 tablet (10 mg total) by mouth daily. (Patient not taking: Reported on 06/22/2016), Disp: 30 tablet, Rfl: 11 .  mometasone (NASONEX) 50 MCG/ACT nasal spray, 2 sprays each nostril once daily (Patient not taking: Reported on 03/23/2016), Disp: 17 g, Rfl: 11  Past Medical History  Diagnosis Date  . Bronchitis   . Asthma started in childhood    URIs are triggers  . Fatty liver 2016  . Pelvic varices 2016    Left pelvic--asymptomatic--on CT Scan   No past surgical history on file.   Social History   Social History  . Marital Status: Married    Spouse Name: Berenice Primas  . Number of Children: 46  . Years of Education: N/A   Occupational History  . House cleaning    Social History Main Topics  . Smoking status: Never Smoker   . Smokeless tobacco: Never Used  . Alcohol Use: No  . Drug Use: No  . Sexual Activity: Not on file   Other Topics Concern  . Not on file   Social History Narrative   Originally from [place]   Came to [place] In 2003   Looking for work.   Lives at home with husband and 2 of her girls.   Family History  Problem Relation Age of Onset  . Ovarian cysts Mother   . Diabetes Sister     . Pancreatitis Brother     unknown etiology    Review of Systems  Constitutional:       Fatigued at home, but happy and with energy at work  HENT: Negative for congestion and hearing loss.        Occasional problem with dysphagia.  Taking Omeprazole daily  Eyes:       Blurred vision for past 2 years.  This week seems a bit worse. Reading glasses help  Respiratory: Negative for cough and shortness of breath.   Cardiovascular: Negative for chest pain, palpitations and leg swelling.  Gastrointestinal: Positive for nausea. Negative for abdominal pain, diarrhea and blood in stool.       Occasional constipation  Mild nausea with decreased appetite this week.  Genitourinary: Negative for dysuria, frequency and vaginal bleeding.  Musculoskeletal: Positive for arthralgias.       Mild bilateral knee pain  Skin: Negative for rash.       No lesion  Neurological: Negative for weakness, numbness and headaches.       No weakness  Psychiatric/Behavioral: Negative for sleep disturbance and dysphoric mood.       Objective:   Physical Exam  Constitutional: She is oriented  to person, place, and time. She appears well-developed and well-nourished.  HENT:  Head: Normocephalic and atraumatic.  Right Ear: Hearing, tympanic membrane, external ear and ear canal normal.  Left Ear: Hearing, tympanic membrane, external ear and ear canal normal.  Nose: Nose normal.  Mouth/Throat: Uvula is midline and oropharynx is clear and moist.  Eyes: Conjunctivae and EOM are normal. Pupils are equal, round, and reactive to light.  Discs sharp bilaterally  Neck: Normal range of motion. Neck supple. No thyroid mass and no thyromegaly present.  Cardiovascular: Normal rate, regular rhythm, S1 normal and S2 normal.  Exam reveals no S3, no S4 and no friction rub.   No murmur heard. No carotid bruit.  Carotid, radial, femoral, DP, and PT pulses normal and equal.  Pulmonary/Chest: Effort normal and breath sounds normal.  Right breast exhibits no mass, no nipple discharge, no skin change and no tenderness. Left breast exhibits no mass, no nipple discharge, no skin change and no tenderness. Breasts are symmetrical.  Abdominal: Soft. Bowel sounds are normal. She exhibits no mass. There is no hepatosplenomegaly. There is no tenderness. No hernia.  Genitourinary: Rectum normal and uterus normal. Rectal exam shows no mass and no tenderness. Guaiac negative stool. Cervix exhibits no motion tenderness and no friability. Right adnexum displays no mass and no tenderness. Left adnexum displays no mass and no tenderness.  Normal external genitalia  Musculoskeletal: Normal range of motion. She exhibits no edema or tenderness.  Lymphadenopathy:       Head (right side): No submental and no submandibular adenopathy present.       Head (left side): No submental and no submandibular adenopathy present.    She has no cervical adenopathy.    She has no axillary adenopathy.       Right: No inguinal and no supraclavicular adenopathy present.       Left: No inguinal and no supraclavicular adenopathy present.  Neurological: She is alert and oriented to person, place, and time. She has normal strength and normal reflexes. No cranial nerve deficit or sensory deficit. Coordination and gait normal.  Skin: Skin is warm and dry. No lesion and no rash noted.  Psychiatric: She has a normal mood and affect. Her behavior is normal. Judgment and thought content normal.          Assessment & Plan:  1.  Annual Exam with pap Mammogram Pap sent Guaiac Cards x 3, to return when returns for fasting labs in next 2 weeks, FLP, CBC, CMP. Immunizations up to date, encouraged yearly influenza vaccine.  2.  Decreased Visual Acuity:  Eye referral.

## 2016-06-23 LAB — CYTOLOGY - PAP

## 2016-06-28 MED ORDER — FLUCONAZOLE 150 MG PO TABS: 150.0000 mg | ORAL_TABLET | Freq: Once | ORAL | Status: DC

## 2016-06-28 NOTE — Progress Notes (Signed)
Left voice message to return call 

## 2016-06-28 NOTE — Addendum Note (Signed)
Addended by: Marcelino Duster on: 06/28/2016 10:26 AM   Modules accepted: Orders

## 2016-07-04 ENCOUNTER — Ambulatory Visit
Admission: RE | Admit: 2016-07-04 | Discharge: 2016-07-04 | Disposition: A | Discharge status: Home / Self Care | Payer: No Typology Code available for payment source | Source: Ambulatory Visit | Attending: Internal Medicine | Admitting: Internal Medicine

## 2016-07-04 DIAGNOSIS — Z1239 Encounter for other screening for malignant neoplasm of breast: Secondary | ICD-10-CM

## 2016-07-10 ENCOUNTER — Other Ambulatory Visit (INDEPENDENT_AMBULATORY_CARE_PROVIDER_SITE_OTHER): Payer: Self-pay

## 2016-07-10 DIAGNOSIS — Z13228 Encounter for screening for other metabolic disorders: Secondary | ICD-10-CM

## 2016-07-10 DIAGNOSIS — Z1211 Encounter for screening for malignant neoplasm of colon: Secondary | ICD-10-CM

## 2016-07-10 DIAGNOSIS — Z0000 Encounter for general adult medical examination without abnormal findings: Secondary | ICD-10-CM

## 2016-07-10 DIAGNOSIS — K760 Fatty (change of) liver, not elsewhere classified: Secondary | ICD-10-CM

## 2016-07-11 LAB — COMPREHENSIVE METABOLIC PANEL
A/G RATIO: 1.6 (ref 1.2–2.2)
ALT: 42 IU/L — AB (ref 0–32)
AST: 30 IU/L (ref 0–40)
Albumin: 4.4 g/dL (ref 3.5–5.5)
Alkaline Phosphatase: 81 IU/L (ref 39–117)
BILIRUBIN TOTAL: 0.4 mg/dL (ref 0.0–1.2)
BUN/Creatinine Ratio: 21 (ref 9–23)
BUN: 13 mg/dL (ref 6–24)
CHLORIDE: 103 mmol/L (ref 96–106)
CO2: 21 mmol/L (ref 18–29)
Calcium: 9.2 mg/dL (ref 8.7–10.2)
Creatinine, Ser: 0.63 mg/dL (ref 0.57–1.00)
GFR calc Af Amer: 118 mL/min/{1.73_m2} (ref >59)
GFR calc non Af Amer: 103 mL/min/{1.73_m2} (ref >59)
Globulin, Total: 2.8 g/dL (ref 1.5–4.5)
Glucose: 84 mg/dL (ref 65–99)
POTASSIUM: 4.1 mmol/L (ref 3.5–5.2)
Sodium: 140 mmol/L (ref 134–144)
Total Protein: 7.2 g/dL (ref 6.0–8.5)

## 2016-07-11 LAB — CBC WITH DIFFERENTIAL/PLATELET
BASOS: 1 %
Basophils Absolute: 0 10*3/uL (ref 0.0–0.2)
EOS (ABSOLUTE): 0.2 10*3/uL (ref 0.0–0.4)
EOS: 5 %
HEMATOCRIT: 40.3 % (ref 34.0–46.6)
Hemoglobin: 13.5 g/dL (ref 11.1–15.9)
IMMATURE GRANULOCYTES: 0 %
Immature Grans (Abs): 0 10*3/uL (ref 0.0–0.1)
LYMPHS ABS: 1.7 10*3/uL (ref 0.7–3.1)
Lymphs: 41 %
MCH: 30.3 pg (ref 26.6–33.0)
MCHC: 33.5 g/dL (ref 31.5–35.7)
MCV: 90 fL (ref 79–97)
MONOS ABS: 0.4 10*3/uL (ref 0.1–0.9)
Monocytes: 11 %
Neutrophils Absolute: 1.7 10*3/uL (ref 1.4–7.0)
Neutrophils: 42 %
Platelets: 161 10*3/uL (ref 150–379)
RBC: 4.46 x10E6/uL (ref 3.77–5.28)
RDW: 13.4 % (ref 12.3–15.4)
WBC: 4.1 10*3/uL (ref 3.4–10.8)

## 2016-07-11 LAB — LIPID PANEL W/O CHOL/HDL RATIO
CHOLESTEROL TOTAL: 167 mg/dL (ref 100–199)
HDL: 43 mg/dL (ref >39)
LDL Calculated: 98 mg/dL (ref 0–99)
TRIGLYCERIDES: 132 mg/dL (ref 0–149)
VLDL Cholesterol Cal: 26 mg/dL (ref 5–40)

## 2016-07-11 LAB — HGB A1C W/O EAG: HEMOGLOBIN A1C: 5.6 % (ref 4.8–5.6)

## 2016-07-23 ENCOUNTER — Other Ambulatory Visit: Payer: Self-pay

## 2016-07-23 DIAGNOSIS — K219 Gastro-esophageal reflux disease without esophagitis: Secondary | ICD-10-CM

## 2016-07-23 MED ORDER — OMEPRAZOLE 20 MG PO CPDR: 20.0000 mg | DELAYED_RELEASE_CAPSULE | Freq: Every day | ORAL | 11 refills | Status: DC

## 2016-07-23 NOTE — Progress Notes (Signed)
omeprazole refill sent.

## 2016-11-14 ENCOUNTER — Other Ambulatory Visit: Payer: Self-pay

## 2016-11-14 ENCOUNTER — Telehealth: Payer: Self-pay | Admitting: Internal Medicine

## 2016-11-14 DIAGNOSIS — K219 Gastro-esophageal reflux disease without esophagitis: Secondary | ICD-10-CM

## 2016-11-14 MED ORDER — OMEPRAZOLE 20 MG PO CPDR: 20.0000 mg | DELAYED_RELEASE_CAPSULE | Freq: Every day | ORAL | 11 refills | Status: DC

## 2016-11-14 NOTE — Telephone Encounter (Signed)
Spoke with Tye Maryland at [facility]. Faxed Rx for Omeprazole to pharmacy. Gabapentin was filled yesterday.

## 2016-11-14 NOTE — Telephone Encounter (Signed)
Patient's daughter went to [facility] Department to pick up Rx.  Pharmacy told her she needed Dr. To call in Rx for authorization.  Patient needs refill on gabapentin(NEURONTIN) 100 mg and omeprazole (PRILOSEC) 20 mg.

## 2016-11-14 NOTE — Telephone Encounter (Signed)
Spoke with Dell Ponto (daughter). Informed Rx's was faxed to the health Dept.

## 2016-11-14 NOTE — Telephone Encounter (Signed)
Patient's daughter went to [facility] Department to pick up Rx.  Pharmacy told her she needed Dr. To call in Rx for authorization.  Patient needs refill on gabapentin(NEURONTIN) 100 mg and omeprazole (PRILOSEC) 20 mg.    Rx was at [facility] on [address].  They do not accept the [employer], so [facility] on [address]. Transferred Rx to [facility] Department.  [facility] [place] Department needs Dr. Amil Amen to call Rx into the [facility] for refill.

## 2016-11-28 ENCOUNTER — Encounter: Payer: Self-pay | Admitting: Internal Medicine

## 2016-11-28 ENCOUNTER — Ambulatory Visit (INDEPENDENT_AMBULATORY_CARE_PROVIDER_SITE_OTHER): Payer: Self-pay | Admitting: Internal Medicine

## 2016-11-28 VITALS — BP 118/72 | HR 78 | Resp 12 | Ht 64.0 in | Wt 187.0 lb

## 2016-11-28 DIAGNOSIS — M546 Pain in thoracic spine: Secondary | ICD-10-CM

## 2016-11-28 DIAGNOSIS — L299 Pruritus, unspecified: Secondary | ICD-10-CM

## 2016-11-28 DIAGNOSIS — R1013 Epigastric pain: Secondary | ICD-10-CM

## 2016-11-28 MED ORDER — CETIRIZINE HCL 10 MG PO TABS: 10.0000 mg | ORAL_TABLET | Freq: Every day | ORAL | 11 refills | Status: DC

## 2016-11-28 NOTE — Progress Notes (Signed)
   Subjective:    Patient ID: Melanie Durham, female    DOB: 12-27-1960, 55 y.o.   MRN: [id number]  HPI   [place] and hit back while wearing heels on Sunday at church.  Describes not recognizing the wooden pew was a lot lower as she was wearing what sounds like about 4 inch heels. Had some discomfort initially that seemed to resolve, but when got home about 1 hour or more later, her back began hurting worse.   Was on her feet in the heels for about 30 minutes during the church service.   Points to the low thoracic back area bilaterally as source of pain. No numbness, tingling, or weakness in legs.   Has been taking Gabapentin she had left (see July visit) she used to take for back pain, but had discontinued.  It seems to help.  Currently taking 100 mg, but would like to increase to 200 mg at bedtime Also taking Omeprazole at bedtime.  Allergies:  Would like a refill of Cetirizine   Current Meds  Medication Sig  . gabapentin (NEURONTIN) 100 MG capsule 1 capsule by mouth daily at bedtime  . omeprazole (PRILOSEC) 20 MG capsule Take 1 capsule (20 mg total) by mouth daily.    Allergies  Allergen Reactions  . Penicillins     Blurred vision and unable to hear--took once when a young girl       Review of Systems     Objective:   Physical Exam   NAD, moves easily to exam table.  Full ROM back NT spinous processes down entire spine. Mild tenderness of right lower thoracic/upper lumbar paraspinous musculature.  Really no tenderness over the left paraspinous musculature. Neuro:  LE:   Motor 5/5, sensory grossly normal to light touch.  DTRs 2+/4 throughout         Assessment & Plan:  1.  Back pain:  Appears to be healing nicely.  Okay to utilize Gabapentin, but discussed would need to take daily and not just as needed for benefit.   She will increase bedtime dose to 200 mg Heating pad as needed with gentle stretches.  Refilled Cetirizine for itching and Omeprazole for  epigastric pain as well.

## 2016-11-28 NOTE — Patient Instructions (Signed)
Heating pad 3 times daily for 15 minutes

## 2017-02-22 ENCOUNTER — Ambulatory Visit: Payer: Self-pay | Admitting: Internal Medicine

## 2017-03-28 ENCOUNTER — Ambulatory Visit: Payer: Self-pay | Admitting: Internal Medicine

## 2017-04-09 ENCOUNTER — Encounter: Payer: Self-pay | Admitting: Internal Medicine

## 2017-04-09 ENCOUNTER — Ambulatory Visit: Payer: Self-pay | Admitting: Internal Medicine

## 2017-04-09 VITALS — BP 114/74 | HR 64 | Resp 16 | Ht 64.0 in | Wt 189.0 lb

## 2017-04-09 DIAGNOSIS — K029 Dental caries, unspecified: Secondary | ICD-10-CM

## 2017-04-09 DIAGNOSIS — R1011 Right upper quadrant pain: Secondary | ICD-10-CM

## 2017-04-09 DIAGNOSIS — L309 Dermatitis, unspecified: Secondary | ICD-10-CM

## 2017-04-09 DIAGNOSIS — J301 Allergic rhinitis due to pollen: Secondary | ICD-10-CM

## 2017-04-09 MED ORDER — TRIAMCINOLONE ACETONIDE 0.1 % EX CREA: 1.0000 "application " | TOPICAL_CREAM | Freq: Two times a day (BID) | CUTANEOUS | 1 refills | Status: AC

## 2017-04-09 NOTE — Progress Notes (Signed)
   Subjective:    Patient ID: Melanie Durham, female    DOB: 09/30/1961, 56 y.o.   MRN: [id number]  HPI   1.   Bilateral Thoracic Back Pain:  Took Gabapentin after last seen in December regularly for 2 months with decreased pain.  Developed pain in her right upper quadrant about 2 months ago, however, which she felt might be due to the back pain, so she stopped taking Gabapentin, which she also felt might be causing the pain.  2.  RUQ pain:  Generally a sharp pain when more prominent, but always some underlying pain.  Not sure if there is any movement/activity/eating that causes a change in the pain.  She did have more severe pain after eating [place] food some time ago-felt like something heavy in her abdomen.  Drank some water, which seemed to help.   Pain can radiate to her right back with the RUQ pain.  No pain on left.   Can have nausea with the pain. Is more constipated, but no color change in stool.  Not sure if a BM improves the pain.  No melena or hematochezia. No definite fever, but can feel chilled with these episodes. Difficulty understanding how long these last or how often.  Sounds like only one episode this month.  Needs cream for skin allergies.    Would like dental referral as is missing teeth, which causes her to bite her tongue frequently  Current Meds  Medication Sig  . cetirizine (ZYRTEC) 10 MG tablet Take 1 tablet (10 mg total) by mouth daily.  Marland Kitchen omeprazole (PRILOSEC) 20 MG capsule Take 1 capsule (20 mg total) by mouth daily.   Allergies  Allergen Reactions  . Penicillins     Blurred vision and unable to hear--took once when a young girl   Review of Systems     Objective:   Physical Exam  NAD Lungs:  CTA CV:  RRR without murmur or rub, radial pulses normal and equal Abd:  Tender mainly in RUQ with mildly + Murphys sign.  Mild midepigastric tenderness.  No rebound or peritoneal signs.  + BS, No HSM or mass Skin:  Dry and covered with raised flat areas  where she has been itching.  Some blistering over dorsal and lateral fingers Back:  No areas of tenderness        Assessment & Plan:  1.  RUQ pain:  Do not feel her previous back pain is related.  Concern for gallbladder disease.  CBC, CMP.  They ultimately decided to have ultrasound done with [facility] and to apply for financial assistance subsequently.  2.  Eczema and seasonal allergies: Dove soap, Triamcinolone cream, Eucerin cream for eczema relief.  To fill and taken Zyrtec  3.  Dental concerns she feels are causing her to bite her tongue:  Referral to Dental clinic.

## 2017-04-09 NOTE — Patient Instructions (Addendum)
Call if pain worsens or go to ED  Dove Soap Eucerin Cream for eczema relief--apply twice daily over prescription cortisone cream, especially after bathing.

## 2017-04-10 LAB — COMPREHENSIVE METABOLIC PANEL
A/G RATIO: 1.2 (ref 1.2–2.2)
ALBUMIN: 4.3 g/dL (ref 3.5–5.5)
ALT: 34 IU/L — ABNORMAL HIGH (ref 0–32)
AST: 26 IU/L (ref 0–40)
Alkaline Phosphatase: 85 IU/L (ref 39–117)
BUN/Creatinine Ratio: 21 (ref 9–23)
BUN: 13 mg/dL (ref 6–24)
Bilirubin Total: 0.3 mg/dL (ref 0.0–1.2)
CALCIUM: 9.2 mg/dL (ref 8.7–10.2)
CO2: 26 mmol/L (ref 18–29)
Chloride: 100 mmol/L (ref 96–106)
Creatinine, Ser: 0.61 mg/dL (ref 0.57–1.00)
GFR, EST AFRICAN AMERICAN: 119 mL/min/{1.73_m2} (ref >59)
GFR, EST NON AFRICAN AMERICAN: 103 mL/min/{1.73_m2} (ref >59)
GLOBULIN, TOTAL: 3.5 g/dL (ref 1.5–4.5)
Glucose: 76 mg/dL (ref 65–99)
POTASSIUM: 4.4 mmol/L (ref 3.5–5.2)
SODIUM: 139 mmol/L (ref 134–144)
TOTAL PROTEIN: 7.8 g/dL (ref 6.0–8.5)

## 2017-04-10 LAB — CBC WITH DIFFERENTIAL/PLATELET
BASOS: 1 %
Basophils Absolute: 0 10*3/uL (ref 0.0–0.2)
EOS (ABSOLUTE): 0.3 10*3/uL (ref 0.0–0.4)
EOS: 6 %
HEMATOCRIT: 38.7 % (ref 34.0–46.6)
HEMOGLOBIN: 13.3 g/dL (ref 11.1–15.9)
IMMATURE GRANS (ABS): 0 10*3/uL (ref 0.0–0.1)
IMMATURE GRANULOCYTES: 0 %
Lymphocytes Absolute: 2.2 10*3/uL (ref 0.7–3.1)
Lymphs: 44 %
MCH: 30.9 pg (ref 26.6–33.0)
MCHC: 34.4 g/dL (ref 31.5–35.7)
MCV: 90 fL (ref 79–97)
MONOS ABS: 0.4 10*3/uL (ref 0.1–0.9)
Monocytes: 9 %
Neutrophils Absolute: 2 10*3/uL (ref 1.4–7.0)
Neutrophils: 40 %
Platelets: 177 10*3/uL (ref 150–379)
RBC: 4.31 x10E6/uL (ref 3.77–5.28)
RDW: 13.5 % (ref 12.3–15.4)
WBC: 5 10*3/uL (ref 3.4–10.8)

## 2017-05-03 ENCOUNTER — Encounter: Payer: Self-pay | Admitting: Internal Medicine

## 2017-05-03 ENCOUNTER — Ambulatory Visit (HOSPITAL_COMMUNITY)
Admission: RE | Admit: 2017-05-03 | Discharge: 2017-05-03 | Disposition: A | Discharge status: Home / Self Care | Payer: No Typology Code available for payment source | Source: Ambulatory Visit | Attending: Internal Medicine | Admitting: Internal Medicine

## 2017-05-03 DIAGNOSIS — K760 Fatty (change of) liver, not elsewhere classified: Secondary | ICD-10-CM | POA: Insufficient documentation

## 2017-05-03 DIAGNOSIS — R1011 Right upper quadrant pain: Secondary | ICD-10-CM

## 2017-05-14 ENCOUNTER — Ambulatory Visit (INDEPENDENT_AMBULATORY_CARE_PROVIDER_SITE_OTHER): Payer: Self-pay | Admitting: Internal Medicine

## 2017-05-14 ENCOUNTER — Encounter: Payer: Self-pay | Admitting: Internal Medicine

## 2017-05-14 VITALS — BP 120/74 | HR 62 | Resp 12 | Ht 64.0 in | Wt 188.0 lb

## 2017-05-14 DIAGNOSIS — R0789 Other chest pain: Secondary | ICD-10-CM

## 2017-05-14 MED ORDER — PREDNISONE 10 MG PO TABS: ORAL_TABLET | ORAL | 0 refills | Status: DC

## 2017-05-14 MED ORDER — GABAPENTIN 100 MG PO CAPS: ORAL_CAPSULE | ORAL | 11 refills | Status: DC

## 2017-05-14 NOTE — Patient Instructions (Addendum)
  Prednisone 10 mg burst and taper directions:  Day 1:  4 tabs Day 2:  4 tabs Day 3:  4 tabs Day 4:  3.5 tabs Day 5:  3 tabs Day 6:  2.5 tabs Day 7:  2 tabs Day 8:  1.5 tabs Day 9:  1 tab Day 10:  0.5 tab Day 11: done  Gabapentin directions:  1 cap at bedtime first night.  In 3 days, if tolerating, increase to 2 caps at bedtime.  In 3 more days, increase to 3 caps at bedtime and stay on this dose until you follow up with me.

## 2017-05-14 NOTE — Progress Notes (Signed)
   Subjective:    Patient ID: Melanie Durham, female    DOB: [birth year], 56 y.o.   MRN: [id number]  HPI   Chest pain:  Involves entire upper left abdomen to shoulder.  Has been having at least since she was last seen end of April.  Cannot say how long it was going on before then.  Was initially intermittent.   Unable to characterize.   Hurts to take a deep breath.   Uses a vacuum cleaner that straps on to her back.  Works with "[employer]	[place]. 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  Has tried gabapentin a couple of times, but not as directed.  No improvement.  Has not been able to refill, as her prescription was expired.    Current Meds  Medication Sig  . cetirizine (ZYRTEC) 10 MG tablet Take 1 tablet (10 mg total) by mouth daily.  Marland Kitchen omeprazole (PRILOSEC) 20 MG capsule Take 1 capsule (20 mg total) by mouth daily.    Allergies  Allergen Reactions  . Penicillins     Blurred vision and unable to hear--took once when a young girl     Review of Systems     Objective:   Physical Exam Obviously uncomfortable moving thorax. Lungs:  CTA CV:  RRR with normal S1 and S2, No S3, S4 , murmur or rub, radial pulses normal and equal Tender with compression of rib cage in any directions.  Very tender overlying anterior left chest wall to shoulder.   Discomfort with abduction and flexion of left shoulder.       Assessment & Plan:  1.  Chest wall pain:  Likely related to carrying around this new vacuum.  Referral to [facility] clinic to work on better posture with use of the vacuum. No work for 2 weeks. Prednisone burst and taper.   For sleep and chronic pain issues:  Restart Gabapentin and titrate to 300 mg at bedtime  Follow up on July 19th

## 2017-06-07 ENCOUNTER — Ambulatory Visit: Payer: Self-pay | Admitting: Internal Medicine

## 2017-06-28 ENCOUNTER — Encounter: Payer: Self-pay | Admitting: Internal Medicine

## 2017-08-06 ENCOUNTER — Telehealth: Payer: Self-pay | Admitting: Internal Medicine

## 2017-08-06 ENCOUNTER — Ambulatory Visit (INDEPENDENT_AMBULATORY_CARE_PROVIDER_SITE_OTHER): Payer: Self-pay | Admitting: Internal Medicine

## 2017-08-06 ENCOUNTER — Encounter: Payer: Self-pay | Admitting: Internal Medicine

## 2017-08-06 VITALS — BP 122/80 | HR 68 | Resp 12 | Ht 64.0 in | Wt 192.0 lb

## 2017-08-06 DIAGNOSIS — K760 Fatty (change of) liver, not elsewhere classified: Secondary | ICD-10-CM

## 2017-08-06 DIAGNOSIS — E785 Hyperlipidemia, unspecified: Secondary | ICD-10-CM

## 2017-08-06 DIAGNOSIS — Z0000 Encounter for general adult medical examination without abnormal findings: Secondary | ICD-10-CM

## 2017-08-06 DIAGNOSIS — Z1231 Encounter for screening mammogram for malignant neoplasm of breast: Secondary | ICD-10-CM

## 2017-08-06 DIAGNOSIS — R0683 Snoring: Secondary | ICD-10-CM

## 2017-08-06 DIAGNOSIS — J351 Hypertrophy of tonsils: Secondary | ICD-10-CM

## 2017-08-06 DIAGNOSIS — K219 Gastro-esophageal reflux disease without esophagitis: Secondary | ICD-10-CM

## 2017-08-06 DIAGNOSIS — Z1239 Encounter for other screening for malignant neoplasm of breast: Secondary | ICD-10-CM

## 2017-08-06 MED ORDER — GABAPENTIN 100 MG PO CAPS: ORAL_CAPSULE | ORAL | 11 refills | Status: AC

## 2017-08-06 MED ORDER — OMEPRAZOLE 20 MG PO CPDR: 20.0000 mg | DELAYED_RELEASE_CAPSULE | Freq: Every day | ORAL | 11 refills | Status: DC

## 2017-08-06 NOTE — Patient Instructions (Addendum)
Tome un vaso de agua antes de cada comida Tome un minimo de 6 a 8 vasos de agua diarios Coma tres veces al dia Coma una proteina y Ardelia Mems grasa saludable con comida.  (huevos, pescado, pollo, pavo, y limite carnes rojas Coma 5 porciones diarias de legumbres.  Mezcle los colores Coma 2 porciones diarias de frutas con cascara cuando sea comestible Use platos pequeos Suelte su tenedor o cuchara despues de cada mordida hata que se mastique y se trague Come en la mesa con amigos o familiares por lo menos una vez al dia Apague la televisin y aparatos electrnicos durante la comida  Su objetivo debe ser perder Ardelia Mems libra por semana  We will check into sleep study cost and get back to you.

## 2017-08-06 NOTE — Progress Notes (Signed)
Subjective:    Patient ID: Melanie Durham, female    DOB: 1961-01-29, 56 y.o.   MRN: [id number]  HPI   CPE with pap  1.  Pap:  Last pap was 06/22/2016.  No family history of cervical cancer  2.  Mammogram:  Last mammogram was 07/04/2016.  Did receive letter last month, but has not scheduled.  No family history of breast cancer.  3.  Osteoprevention:  Not clear, but may have received regular corticosteroid shots for "skin allergy to sun"  From age 77 to early 8s.  Did not gain weight with this.  Eats 1 serving of Activia daily and one cup fortified unsweetened Almond milk.  No family history of osteoporosis.   Not very physically active.  4.  Guaiac Cards:  Once before..  No family history of colon cancer.  5.  Colonoscopy:  Never.  As above.  6.  Immunizations:  Immunization History  Administered Date(s) Administered  . Influenza,inj,Quad PF,6+ Mos 08/14/2014  . Tdap 10/22/2015    7.  Glucose/Cholesterol:  Has had an A1C up to 5.6 % in 2015, but sugars since have been fine.  Had slightly low HDL last July, otherwise cholesterol panel was fine.  Current Meds  Medication Sig  . cetirizine (ZYRTEC) 10 MG tablet Take 1 tablet (10 mg total) by mouth daily.   Allergies  Allergen Reactions  . Penicillins     Blurred vision and unable to hear--took once when a young girl    Past Medical History:  Diagnosis Date  . Allergy   . Asthma started in childhood   URIs are triggers  . Bronchitis   . Fatty liver 2016  . Pelvic varices 2016   Left pelvic--asymptomatic--on CT Scan    History reviewed. No pertinent surgical history.   Family History  Problem Relation Age of Onset  . Ovarian cysts Mother   . Diabetes Sister   . Pancreatitis Brother        unknown etiology    Social History   Social History  . Marital status: Married    Spouse name: Berenice Primas  . Number of children: 72  . Years of education: N/A   Occupational History  . House cleaning      Social History Main Topics  . Smoking status: Never Smoker  . Smokeless tobacco: Never Used  . Alcohol use No  . Drug use: No  . Sexual activity: Not on file   Other Topics Concern  . Not on file   Social History Narrative   Originally from [place]   Came to [place] In 2003   Looking for work.   Lives at home with husband and 2 of her girls.     Review of Systems  Constitutional: Positive for fatigue (snores and sometimes stops breathing per daughter). Negative for unexpected weight change.  Respiratory: Negative for shortness of breath.   Cardiovascular: Negative for chest pain.  Gastrointestinal: Positive for abdominal pain (epigastric and out of omeprazole.). Negative for blood in stool.       Objective:   Physical Exam  Constitutional: She is oriented to person, place, and time. She appears well-developed and well-nourished.  HENT:  Head: Normocephalic and atraumatic.  Right Ear: Hearing, tympanic membrane, external ear and ear canal normal.  Left Ear: Hearing, tympanic membrane, external ear and ear canal normal.  Nose: Nose normal.  Mouth/Throat: Uvula is midline, oropharynx is clear and moist and mucous membranes are normal.  Tonsils quite large  and almost touching midline.  No erythema or exudate.  Eyes: Pupils are equal, round, and reactive to light. Conjunctivae and EOM are normal.  Discs sharp bilaterally  Neck: Normal range of motion and full passive range of motion without pain. Neck supple. No thyroid mass and no thyromegaly present.  Cardiovascular: Normal rate and regular rhythm.  Exam reveals no S3, no S4 and no friction rub.   No murmur heard. No carotid bruits.  Carotid, radial, femoral, DP and PT pulses normal and equal.   Pulmonary/Chest: Effort normal and breath sounds normal. Right breast exhibits no inverted nipple, no mass, no nipple discharge, no skin change and no tenderness. Left breast exhibits no inverted nipple, no mass, no nipple discharge, no  skin change and no tenderness.  Abdominal: Soft. Bowel sounds are normal. She exhibits no mass. There is no hepatosplenomegaly. There is tenderness (some tenderness throughout left abdomen.  No abnormality palpated). There is no rebound (no peritoneal signs.). No hernia.  Genitourinary:  Genitourinary Comments: Normal external genitalia,  No uterine or adnexal mass or tenderness.  Tenderness in LLQ as noted on abdominal exam is not consistent here.  Rectal:  External tag.  No mass, heme negative light brown stool.  Anal tone normal.  Musculoskeletal: Normal range of motion.  Lymphadenopathy:       Head (right side): No submental and no submandibular adenopathy present.       Head (left side): No submental and no submandibular adenopathy present.    She has no cervical adenopathy.    She has no axillary adenopathy.       Right: No inguinal and no supraclavicular adenopathy present.       Left: No inguinal and no supraclavicular adenopathy present.  Neurological: She is alert and oriented to person, place, and time. She has normal strength and normal reflexes. She displays normal reflexes. No cranial nerve deficit or sensory deficit. Coordination and gait normal.  Skin: Skin is warm and dry. No rash (Old hyperpigmention over arms and face from rashes as young woman.) noted.  Psychiatric: She has a normal mood and affect. Her speech is normal and behavior is normal. Judgment and thought content normal. Cognition and memory are normal.          Assessment & Plan:  1.  CPE without pap Mammogram scholarship papers completed. Fasting labs:  FLP, CBC, CMP Flu vaccine clinic information given.  2.  Snoring:  Will look into cost of sleep study, but in meantime, ENT referral to look at tonsils, which may be the cause of her daytime fatigue and sleepiness.  3.  GERD:  Restart Omeprazole with reflux precautions.    4.  Fatty liver:  Encouraged continued lifestyle changes.  Not physically active.   Discussed weight loss could also possibly help with snoring.

## 2017-08-08 LAB — COMPREHENSIVE METABOLIC PANEL
A/G RATIO: 1.2 (ref 1.2–2.2)
ALBUMIN: 4.5 g/dL (ref 3.5–5.5)
ALT: 54 IU/L — AB (ref 0–32)
AST: 40 IU/L (ref 0–40)
Alkaline Phosphatase: 91 IU/L (ref 39–117)
BILIRUBIN TOTAL: 0.6 mg/dL (ref 0.0–1.2)
BUN / CREAT RATIO: 23 (ref 9–23)
BUN: 12 mg/dL (ref 6–24)
CALCIUM: 9.3 mg/dL (ref 8.7–10.2)
CHLORIDE: 102 mmol/L (ref 96–106)
CO2: 22 mmol/L (ref 20–29)
Creatinine, Ser: 0.53 mg/dL — ABNORMAL LOW (ref 0.57–1.00)
GFR, EST AFRICAN AMERICAN: 124 mL/min/{1.73_m2} (ref >59)
GFR, EST NON AFRICAN AMERICAN: 108 mL/min/{1.73_m2} (ref >59)
GLUCOSE: 71 mg/dL (ref 65–99)
Globulin, Total: 3.7 g/dL (ref 1.5–4.5)
Potassium: 4.1 mmol/L (ref 3.5–5.2)
Sodium: 140 mmol/L (ref 134–144)
TOTAL PROTEIN: 8.2 g/dL (ref 6.0–8.5)

## 2017-08-08 LAB — CBC WITH DIFFERENTIAL/PLATELET
BASOS: 1 %
Basophils Absolute: 0 10*3/uL (ref 0.0–0.2)
EOS (ABSOLUTE): 0.3 10*3/uL (ref 0.0–0.4)
Eos: 6 %
HEMOGLOBIN: 13.4 g/dL (ref 11.1–15.9)
Hematocrit: 41.5 % (ref 34.0–46.6)
IMMATURE GRANS (ABS): 0 10*3/uL (ref 0.0–0.1)
IMMATURE GRANULOCYTES: 0 %
LYMPHS: 42 %
Lymphocytes Absolute: 2.3 10*3/uL (ref 0.7–3.1)
MCH: 30.5 pg (ref 26.6–33.0)
MCHC: 32.3 g/dL (ref 31.5–35.7)
MCV: 94 fL (ref 79–97)
MONOCYTES: 9 %
Monocytes Absolute: 0.5 10*3/uL (ref 0.1–0.9)
NEUTROS ABS: 2.2 10*3/uL (ref 1.4–7.0)
NEUTROS PCT: 42 %
Platelets: 160 10*3/uL (ref 150–379)
RBC: 4.4 x10E6/uL (ref 3.77–5.28)
RDW: 13.6 % (ref 12.3–15.4)
WBC: 5.3 10*3/uL (ref 3.4–10.8)

## 2017-08-08 LAB — LIPID PANEL W/O CHOL/HDL RATIO
Cholesterol, Total: 189 mg/dL (ref 100–199)
HDL: 44 mg/dL (ref >39)
LDL Calculated: 114 mg/dL — ABNORMAL HIGH (ref 0–99)
Triglycerides: 156 mg/dL — ABNORMAL HIGH (ref 0–149)
VLDL CHOLESTEROL CAL: 31 mg/dL (ref 5–40)

## 2017-08-14 NOTE — Telephone Encounter (Signed)
Spoke with sleep center. Informed sleep study cost between $3200-$3600 depending on the type of test. States test is determined at the time of sleep study. Patient may set up a monthly payment plan or apply for financial assistance through the billing office after the sleep study. To Dr. Amil Amen for further direction.

## 2017-08-22 ENCOUNTER — Telehealth: Payer: Self-pay | Admitting: Internal Medicine

## 2017-08-22 NOTE — Telephone Encounter (Signed)
Error

## 2017-08-28 ENCOUNTER — Ambulatory Visit
Admission: RE | Admit: 2017-08-28 | Discharge: 2017-08-28 | Disposition: A | Discharge status: Home / Self Care | Payer: No Typology Code available for payment source | Source: Ambulatory Visit | Attending: Internal Medicine | Admitting: Internal Medicine

## 2017-08-28 DIAGNOSIS — Z1239 Encounter for other screening for malignant neoplasm of breast: Secondary | ICD-10-CM

## 2017-09-04 ENCOUNTER — Other Ambulatory Visit (INDEPENDENT_AMBULATORY_CARE_PROVIDER_SITE_OTHER): Payer: Self-pay

## 2017-09-04 DIAGNOSIS — Z1211 Encounter for screening for malignant neoplasm of colon: Secondary | ICD-10-CM

## 2017-09-04 LAB — POC HEMOCCULT BLD/STL (HOME/3-CARD/SCREEN)
Card #2 Fecal Occult Blod, POC: NEGATIVE
Card #3 Fecal Occult Blood, POC: NEGATIVE
FECAL OCCULT BLD: NEGATIVE

## 2017-09-05 NOTE — Telephone Encounter (Signed)
Please let patient know the cost and let her know about applying for [facility] Financial assistance--how it works.

## 2017-09-07 NOTE — Telephone Encounter (Signed)
To Melanie Durham, please inform patient

## 2017-09-25 ENCOUNTER — Ambulatory Visit (INDEPENDENT_AMBULATORY_CARE_PROVIDER_SITE_OTHER): Payer: Self-pay | Admitting: Internal Medicine

## 2017-09-25 ENCOUNTER — Encounter: Payer: Self-pay | Admitting: Internal Medicine

## 2017-09-25 VITALS — BP 124/78 | HR 70 | Resp 12 | Ht 64.0 in | Wt 193.0 lb

## 2017-09-25 DIAGNOSIS — K0889 Other specified disorders of teeth and supporting structures: Secondary | ICD-10-CM

## 2017-09-25 NOTE — Patient Instructions (Signed)
Call if pain gets worse and will consider start of Clindamycin as you are allergic to penicillin

## 2017-09-25 NOTE — Progress Notes (Signed)
   Subjective:    Patient ID: Melanie Durham, female    DOB: 09-05-1961, 56 y.o.   MRN: [id number]  HPI   Right upper tooth pain for 2-3 days.  Has been using Hydrogen peroxide swish with some improvement.  Now swelling of gingiva or pustular dishcharge. Taking Tylenol with some help.  Does not tolerate NSAIDs as they bother her stomach and cause her hemorrhoids to bleed.    Current Meds  Medication Sig  . cetirizine (ZYRTEC) 10 MG tablet Take 1 tablet (10 mg total) by mouth daily.  Marland Kitchen gabapentin (NEURONTIN) 100 MG capsule 3 caps by mouth at bedtime daily.  Marland Kitchen omeprazole (PRILOSEC) 20 MG capsule Take 1 capsule (20 mg total) by mouth daily.   Allergies  Allergen Reactions  . Penicillins     Blurred vision and unable to hear--took once when a young girl      Review of Systems     Objective:   Physical Exam  NAD HEENT:  One molar left in upper right jaw.  No surrounding erythema or swelling of gingiva. Minimal pain with pressure on tooth. Throat without injection Neck: supple, No adenopathy Chest:  CTA CV:  RRR without murmur or rub.  Radial pulses normal and equal.       Assessment & Plan:  Dental pain:  To call if worsens.  No antibiotics for now.  Dental referral.

## 2017-09-26 ENCOUNTER — Telehealth: Payer: Self-pay | Admitting: Internal Medicine

## 2017-09-26 NOTE — Telephone Encounter (Signed)
To Antony Madura please look into this

## 2017-09-26 NOTE — Telephone Encounter (Signed)
Referral unable to fill due to OC expired on 08/25/17. Antony Madura called patient on 09/06/17 when received spreadsheet from [facility] and informed patient about it. Patient agreed will bring in current OC when she re-apply.

## 2017-09-28 NOTE — Telephone Encounter (Signed)
noted 

## 2017-11-16 ENCOUNTER — Ambulatory Visit: Payer: Self-pay | Admitting: Internal Medicine

## 2017-11-16 ENCOUNTER — Encounter: Payer: Self-pay | Admitting: Internal Medicine

## 2017-11-16 VITALS — BP 122/78 | HR 66 | Temp 98.2°F | Resp 12 | Ht 64.0 in | Wt 191.0 lb

## 2017-11-16 DIAGNOSIS — J3089 Other allergic rhinitis: Secondary | ICD-10-CM | POA: Insufficient documentation

## 2017-11-16 DIAGNOSIS — K0889 Other specified disorders of teeth and supporting structures: Secondary | ICD-10-CM

## 2017-11-16 DIAGNOSIS — L299 Pruritus, unspecified: Secondary | ICD-10-CM

## 2017-11-16 DIAGNOSIS — J0140 Acute pansinusitis, unspecified: Secondary | ICD-10-CM

## 2017-11-16 MED ORDER — DOXYCYCLINE HYCLATE 100 MG PO TABS: ORAL_TABLET | ORAL | 0 refills | Status: DC

## 2017-11-16 MED ORDER — MOMETASONE FUROATE 50 MCG/ACT NA SUSP: NASAL | 11 refills | Status: DC

## 2017-11-16 MED ORDER — PREDNISONE 20 MG PO TABS: ORAL_TABLET | ORAL | 0 refills | Status: DC

## 2017-11-16 NOTE — Patient Instructions (Signed)
Until you get Nasonex, use Fluticasone nasal spray over the counter [facility])  2 sprays each nostril once daily

## 2017-11-16 NOTE — Progress Notes (Signed)
   Subjective:    Patient ID: Melanie Durham, female    DOB: 01/17/1961, 56 y.o.   MRN: [id number]  HPI   Cough for 1 month.  Started out with a cold.  Had headache, body aches, sore throat, posterior pharyngeal drainage. Felt she was having a fever at the time.  She states about 3 weeks ago, she was starting to feel better, but then raked all the leaves in her yard and started to worsen, the cough in particular.  She has been taking her Cetirizine.  Has been out of Nasonex for some time as never refilled as Rx expired. Taking OTC cold and cough remedies with limited short term improvement. Her throat is itchy.  No eye symptoms.  No sneezing or itchy nose--just dry.  No dyspnea   No smoker's around her.  Still with dental pain--has not heard from dentist.  Current Meds  Medication Sig  . acetaminophen (TYLENOL) 500 MG tablet Take 500 mg by mouth every 6 (six) hours as needed.  . cetirizine (ZYRTEC) 10 MG tablet Take 1 tablet (10 mg total) by mouth daily.  Marland Kitchen DM-Phenylephrine-Acetaminophen (VICKS DAYQUIL COLD & FLU PO) Take by mouth daily as needed.  Marland Kitchen omeprazole (PRILOSEC) 20 MG capsule Take 1 capsule (20 mg total) by mouth daily.  Marland Kitchen Phenyleph-Doxylamine-DM-APAP (NYQUIL SEVERE COLD/FLU PO) Take by mouth.    Allergies  Allergen Reactions  . Penicillins     Blurred vision and unable to hear--took once when a young girl        Review of Systems     Objective:   Physical Exam  NAD, frequent dry to congested cough HEENT:  PERRL, EOMI, conjunctivae without injection, Nontender over frontal and maxillary sinuses.  Throat without injection or exudate.  Nasal mucosa swollen with clear and yellow discharge.  Mild swelling of gingiva about caried tooth.  Many missing teeth. Neck: Supple, No adenopathy Chest:  CTA CV:  RRR without murmur or rub,  Radial and DP pulses normal and equal LE:  No edema        Assessment & Plan:  1.  Prolonged cough with allergy symptoms now as  well.   Prednisone 20 mg daily for 5 days. Doxycycline 100 mg twice daily for 10 days for both possible sinusitis and dental infection. Encouraged signing up with [facility] for Nasonex.  In meantime, to obtain Fluticasone nasal OTC and utilize 2 sprays each nostril daily.   Zyrtec 10 g --to restart after done with OTC cold remedies. Hold on influenza immunization for now.

## 2018-01-17 ENCOUNTER — Encounter: Payer: Self-pay | Admitting: Internal Medicine

## 2018-01-17 ENCOUNTER — Ambulatory Visit: Payer: Self-pay | Admitting: Internal Medicine

## 2018-01-17 VITALS — BP 130/72 | HR 94 | Temp 100.9°F | Resp 14 | Ht 64.0 in | Wt 193.0 lb

## 2018-01-17 DIAGNOSIS — B349 Viral infection, unspecified: Secondary | ICD-10-CM

## 2018-01-17 MED ORDER — OSELTAMIVIR PHOSPHATE 75 MG PO CAPS: ORAL_CAPSULE | ORAL | 0 refills | Status: DC

## 2018-01-17 NOTE — Progress Notes (Signed)
   Subjective:    Patient ID: Melanie Durham, female    DOB: 1961-04-21, 57 y.o.   MRN: [id number]  HPI   Sick for 3 days.  Started 2.5 days ago.  Cough, fever, headache, and bodyaches. Lots of mucous production.  Nose, pharynx.  White to light yellow mucous.  Feels short of breath at times.  Describes more so choking on mucous in her throat, not a sense of dyspnea in chest.   No abdominal pain.  + nausea. Has had some vomiting.  No diarrhea.  Poor appetite--just drinking fluids Good urination. Her husband with similar illness at home. Tylenol, Ibuprofen, Theraflu Took a teaspoon of Amoxicillin before coming in. Nothing has helped  Current Meds  Medication Sig  . acetaminophen (TYLENOL) 500 MG tablet Take 500 mg by mouth every 6 (six) hours as needed.  Marland Kitchen DM-Phenylephrine-Acetaminophen (VICKS DAYQUIL COLD & FLU PO) Take by mouth daily as needed.  Marland Kitchen omeprazole (PRILOSEC) 20 MG capsule Take 1 capsule (20 mg total) by mouth daily.  Marland Kitchen Phenyleph-Doxylamine-DM-APAP (NYQUIL SEVERE COLD/FLU PO) Take by mouth.    Allergies  Allergen Reactions  . Penicillins     Blurred vision and unable to hear--took once when a young girl      Review of Systems     Objective:   Physical Exam  Crying at times with difficulty mobilizing mucous. copious clear nasal mucous. HEENT:  PERRL EOMI, TMs pearly gray, throat with minimal injection without exudate. Neck:  Supple No adenopathy Lungs CTA CV:  RRR without murmur or rub. Radial pulses normal and equal. Abd:  S, NT, No HSM or mass. Skin;  Nor rash.      Assessment & Plan:  Viral syndrome, likely influenza.  Okay to use Dayquil/Nyquil combination. Push fluids. Tamiflu 75 mg twice daily for 5 days. Call progress report tomorrow.

## 2018-01-17 NOTE — Patient Instructions (Addendum)
Drink lots of water Cool mist humidifier Dayquil and Nyquil--but do not take extra Tylenol. Can take Ibuprofen in between the Dayquil and Nyquil if fever or aches or headaches  Call a progress report tomorrow--need to know how you are doing.

## 2018-01-18 ENCOUNTER — Telehealth: Payer: Self-pay | Admitting: Internal Medicine

## 2018-01-18 NOTE — Telephone Encounter (Signed)
Melanie Durham called patient back. States she picked up Tamiflu yesterday. States she is feeling much better. She states she is not having any body aches and fever has gone down.. Patient state she is feeling better.  To Dr. Jodelle Green

## 2018-01-18 NOTE — Telephone Encounter (Signed)
Patient called to gives updated regarding how is feeling today and  stated cough is improving but headache still the same.  Cherice please advise.

## 2018-01-21 ENCOUNTER — Other Ambulatory Visit: Payer: Self-pay

## 2018-01-21 DIAGNOSIS — K219 Gastro-esophageal reflux disease without esophagitis: Secondary | ICD-10-CM

## 2018-01-21 DIAGNOSIS — L299 Pruritus, unspecified: Secondary | ICD-10-CM

## 2018-01-21 MED ORDER — OMEPRAZOLE 20 MG PO CPDR
20.0000 mg | DELAYED_RELEASE_CAPSULE | Freq: Every day | ORAL | 11 refills | Status: DC
Start: 2018-01-21 — End: 2018-06-05

## 2018-01-21 MED ORDER — CETIRIZINE HCL 10 MG PO TABS: 10.0000 mg | ORAL_TABLET | Freq: Every day | ORAL | 11 refills | Status: DC

## 2018-06-05 ENCOUNTER — Ambulatory Visit: Payer: Self-pay | Admitting: Internal Medicine

## 2018-06-05 ENCOUNTER — Encounter: Payer: Self-pay | Admitting: Internal Medicine

## 2018-06-05 VITALS — BP 140/82 | HR 62 | Resp 12 | Ht 64.0 in | Wt 193.0 lb

## 2018-06-05 DIAGNOSIS — J3089 Other allergic rhinitis: Secondary | ICD-10-CM

## 2018-06-05 DIAGNOSIS — K219 Gastro-esophageal reflux disease without esophagitis: Secondary | ICD-10-CM

## 2018-06-05 MED ORDER — FEXOFENADINE HCL 180 MG PO TABS: 180.0000 mg | ORAL_TABLET | Freq: Every day | ORAL | 11 refills | Status: DC

## 2018-06-05 MED ORDER — OMEPRAZOLE 20 MG PO CPDR: 20.0000 mg | DELAYED_RELEASE_CAPSULE | Freq: Every day | ORAL | 11 refills | Status: AC

## 2018-06-05 MED ORDER — FLUTICASONE PROPIONATE 50 MCG/ACT NA SUSP: 2.0000 | Freq: Every day | NASAL | 11 refills | Status: DC

## 2018-06-05 NOTE — Patient Instructions (Signed)
Enfermedad de reflujo gastroesofágico en los adultos  Gastroesophageal Reflux Disease, Adult  Normalmente, la comida baja por el esófago y se mantiene en el estómago, donde se la digiere. Sin embargo, cuando una persona tiene enfermedad de reflujo gastroesofágico (ERGE), la comida y jugo gástrico (ácido estomacal) suben por el esófago. Cuando esto ocurre, el esófago se inflama y duele. Con el tiempo, la ERGE puede producir pequeños agujeros (úlceras) en el revestimiento del esófago.  ¿Cuáles son las causas?  Esta afección se debe a un problema en el músculo que se encuentra entre el esófago y el estómago (esfínter esofágico inferior, o EEI). Normalmente, el EEI se cierra una vez que la comida pasa a través del esófago hasta el estómago. Cuando el EEI se encuentra debilitado o tiene alguna anomalía, no se cierra por completo, y eso permite que tanto la comida como el jugo gástrico, que es ácido, vuelvan a subir por el esófago. El EEI puede debilitarse a causa de ciertas sustancias alimenticias, medicamentos y afecciones, que incluyen:  · Consumo de tabaco.  · Embarazo.  · Tener una hernia de hiato.  · Consumo excesivo de alcohol.  · Ciertos alimentos y bebidas, como café, chocolate, cebollas y menta.    ¿Qué incrementa el riesgo?  Es más probable que esta afección se manifieste en:  · Personas con aumento del peso corporal.  · Personas con trastornos del tejido conjuntivo.  · Personas que toman antiinflamatorios no esteroideos (AINE).    ¿Cuáles son los signos o los síntomas?  Los síntomas de esta afección incluyen lo siguiente:  · Acidez estomacal.  · Dificultad o dolor al tragar.  · Sensación de tener un bulto en la garganta.  · Sabor amargo en la boca.  · Mal aliento.  · Gran cantidad de saliva.  · Estómago inflamado o con malestar.  · Eructos.  · Dolor en el pecho.  · Dificultad para respirar o sibilancias.  · Tos constante (crónica) o tos nocturna.  · Desgaste del esmalte dental.  · Pérdida de peso.     El dolor de pecho puede deberse a distintas enfermedades. Es importante que consulte al médico si tiene dolor de pecho.  ¿Cómo se diagnostica?  El médico le hará una historia clínica y un examen físico. Para determinar si tiene ERGE leve o grave, el médico también puede controlar cómo usted reacciona al tratamiento. También pueden hacerle otros estudios, por ejemplo:  · Una endoscopía para examinarle el estómago y el esófago con una cámara pequeña.  · Una prueba para medir el grado de acidez en el esófago.  · Una prueba para medir cuánta presión hay en el esófago.  · Un estudio de tránsito baritado regular o modificado para ver la forma, el tamaño y el funcionamiento del esófago.    ¿Cómo se trata?  El objetivo del tratamiento es ayudar a aliviar los síntomas y evitar las complicaciones. El tratamiento de esta afección puede variar según la gravedad de los síntomas. El médico puede recomendarle lo siguiente:  · Cambios en la dieta.  · Medicamentos.  · Someterse a una cirugía.    Siga estas instrucciones en su casa:  Dieta  · Siga la dieta recomendada por el médico. Esto puede incluir evitar ciertos alimentos y bebidas, por ejemplo:  ? Café y té (con o sin cafeína).  ? Bebidas que contengan alcohol.  ? Bebidas energéticas y deportivas.  ? Bebidas gaseosas y refrescos.  ? Chocolate y cacao.  ? Menta y esencia de menta.  ?   Ajo y cebolla.  ? Rábano picante.  ? Alimentos condimentados, picantes y ácidos, por ejemplo, todos los tipos de pimientas, chile en polvo, curry en polvo, vinagre, salsas picantes y salsa barbacoa.  ? Cítricos y sus jugos, por ejemplo, naranjas, limones y limas.  ? Alimentos a base de tomate, como salsa de tomate, chile, salsa picante y pizza con salsa de tomate.  ? Alimentos fritos y grasos, como donas, papas fritas y aderezos ricos en grasas.  ? Carnes con alto contenido de grasa, como salchichas, y cortes de carnes rojas y blancas con mucha grasa, por ejemplo, chuletas o costillas,  embutidos, jamón y tocino.  ? Productos lácteos ricos en grasas, como leche entera, manteca y queso crema.  · Haga varias comidas pequeñas y frecuentes durante el día en lugar de comidas abundantes.  · Evite beber grandes cantidades de líquidos con las comidas.  · Evite comer 2 o 3 horas antes de acostarse.  · Evite recostarse inmediatamente después de comer.  · No haga ejercicios enseguida después de comer.  Instrucciones generales  · Esté atento a cualquier cambio en los síntomas.  · Tome los medicamentos de venta libre y los recetados solamente como se lo haya indicado el médico. No tome aspirina, ibuprofeno ni otros antiinflamatorios no esteroideos (AINE) a menos que el médico se lo indique.  · No consuma ningún producto que contenga tabaco, lo que incluye cigarrillos, tabaco de mascar y cigarrillos electrónicos. Si necesita ayuda para dejar de fumar, consulte al médico.  · Use ropas sueltas. No use nada apretado alrededor de la cintura que haga presión sobre el abdomen.  · Levante (eleve) la cabecera de la cama 6 pulgadas (15 cm).  · Trate de reducir el nivel de estrés con actividades tales como el yoga o la meditación. Si necesita ayuda para reducir el nivel de estrés, consulte al médico.  · Si tiene sobrepeso, baje hasta llegar a un peso saludable para usted. Pídale consejos al médico para bajar de peso de manera segura.  · Concurra a todas las visitas de control como se lo haya indicado el médico. Esto es importante.  Comuníquese con un médico si:  · Aparecen nuevos síntomas.  · Baja de peso sin causa aparente.  · Tiene dificultad para tragar, o le duele cuando traga.  · Tiene tos persistente o sibilancias.  · Los síntomas no mejoran con el tratamiento.  · Tiene la voz ronca.  Solicite ayuda de inmediato si:  · Siente dolor en los brazos, el cuello, la mandíbula, los dientes o la espalda.  · Se siente transpirado, mareado o tiene una sensación de desvanecimiento.  · Siente falta de aire o dolor en el pecho.   · Vomita y el vómito tiene un aspecto similar a la sangre o a los granos de café.  · Se desmaya.  · Las heces son sanguinolentas o negras.  · No puede tragar, beber o comer.  Esta información no tiene como fin reemplazar el consejo del médico. Asegúrese de hacerle al médico cualquier pregunta que tenga.  Document Released: 09/06/2005 Document Revised: 03/02/2017 Document Reviewed: 03/24/2015  Elsevier Interactive Patient Education © 2018 [employer]

## 2018-06-05 NOTE — Progress Notes (Signed)
   Subjective:    Patient ID: Melanie Durham, female    DOB: May 29, 1961, 57 y.o.   MRN: [id number]  HPI   Difficult history. She believes she has bad breath--not clear if she smells a bad breath or has a bad taste. Describes developing difficulty swallowing, like food getting stuck in throat about 1 month ago.  She believes the problem with her breath started after this as well. She ran out of some of her medications beginning of May, including Omeprazole.  She does eat chocolate occasionally.  She does not drink coffee.  Hot tea with sugar and drinks sweet tea.  Drinks Coca cola 3 servings weekly.   No alcohol, tobacco.  Does eat a lot of onions.  Occasional NSAID.  No outpatient medications have been marked as taking for the 06/05/18 encounter (Office Visit) with Mack Hook, MD.   Allergies  Allergen Reactions  . Penicillins     Blurred vision and unable to hear--took once when a young girl          Review of Systems     Objective:   Physical Exam NAD HEENT:  PERRL, EOMI, TMs pearly gray, throat with mild posterior cobbling.  Nasal mucosa with bogginess and clear discharge. Neck:  Supple, No adenopathy, no thyromegaly Chest:  CTA CV:  RRR without murmur or rub, radial and DP pulses normal and equal Abd:  S, NT, No HSM or mass, + BS        Assessment & Plan:  1.  GERD:  GERD precautions and restart Omeprazole 20 mg daily. Avoid exacerbating foods or drink   2.  Allergies:  Fexofenadine 180 mg daily, Flonase 2 sprays each nostril daily.    Follow up in 2 months.

## 2018-06-26 ENCOUNTER — Ambulatory Visit: Payer: Self-pay | Admitting: Internal Medicine

## 2018-08-05 ENCOUNTER — Encounter: Payer: Self-pay | Admitting: Internal Medicine

## 2018-08-05 ENCOUNTER — Ambulatory Visit: Payer: Self-pay | Admitting: Internal Medicine

## 2018-08-05 VITALS — BP 132/82 | HR 66 | Resp 12 | Ht 64.0 in | Wt 192.0 lb

## 2018-08-05 DIAGNOSIS — J351 Hypertrophy of tonsils: Secondary | ICD-10-CM

## 2018-08-05 DIAGNOSIS — J3089 Other allergic rhinitis: Secondary | ICD-10-CM

## 2018-08-05 DIAGNOSIS — K219 Gastro-esophageal reflux disease without esophagitis: Secondary | ICD-10-CM

## 2018-08-05 MED ORDER — MOMETASONE FUROATE 50 MCG/ACT NA SUSP: NASAL | 12 refills | Status: AC

## 2018-08-05 MED ORDER — LORATADINE 10 MG PO TABS: 10.0000 mg | ORAL_TABLET | Freq: Every day | ORAL | 11 refills | Status: AC

## 2018-08-05 NOTE — Patient Instructions (Signed)
Ask your daughters if you stop breathing when you snore

## 2018-08-05 NOTE — Progress Notes (Signed)
   Subjective:    Patient ID: Melanie Durham, female    DOB: 10-Mar-1961, 57 y.o.   MRN: [id number]  HPI   1.  GERD:  Ran out of Omeprazole for 3 days, was able to restart 3 days ago.  Her dysphagia and sense of bad breath resolved.  The 3 days she did not take the medicine, she did develop a sore throat and pain on right side.   Has not elevated HOB.   She has not run out of gabapentin and recognizes that is not for GERD.  2.  Allergies:  Taking Loratadine 10 mg daily.  She did run out of nasal fluticasone and feels she needs this for her throat symptoms.  She was not using properly, however, using only as needed.  Current Meds  Medication Sig  . fexofenadine (ALLEGRA) 180 MG tablet Take 1 tablet (180 mg total) by mouth daily.  Marland Kitchen gabapentin (NEURONTIN) 100 MG capsule 3 caps by mouth at bedtime daily.  Marland Kitchen omeprazole (PRILOSEC) 20 MG capsule Take 1 capsule (20 mg total) by mouth daily.  Marland Kitchen triamcinolone cream (KENALOG) 0.1 % Apply 1 application topically 2 (two) times daily.   Allergies  Allergen Reactions  . Penicillins     Blurred vision and unable to hear--took once when a young girl      Review of Systems     Objective:   Physical Exam NAD HEENT:  PERRL, EOMI, TMs pearly gray, throat with large tonsils.  No erythema or exudate.  Unable to see posterior pharynx well. Nasal mucosa swollen with clear discharge. Neck:  Supple, No adenopathy Chest:  CTA CV:  RRR without murmur or rub.  Radial and DP pulses normal and equal. Abd:  S, NT, No HSM or mass, + BS       Assessment & Plan:  1.  GERD:  Reflux precautions and maintain daily Omeprazole.  2.  Allergies:  Refill of Claritin.  Rx for Nasonex to [facility].  May continue to use Fluticasone nasal until can apply and obtain Nasonex.    3.  Enlarged tonsils:  No evidence of OSA, but she will check with her daughters as to whether she snores.  Sleep study not available through [facility] and expensive/would need to apply for  financial assistance through [facility].

## 2018-12-09 ENCOUNTER — Encounter: Payer: Self-pay | Admitting: Internal Medicine

## 2019-01-01 IMAGING — US US ABDOMEN COMPLETE
1 series · 14 of 25 positions shown · non-contrast
Comparison: 09/16/2015

CLINICAL DATA: Right upper quadrant pain

EXAM:
ABDOMEN ULTRASOUND COMPLETE

[Series 1: us abdomen complete · 0.20mm/px · 14 of 99 slices shown]
[im 1/99]
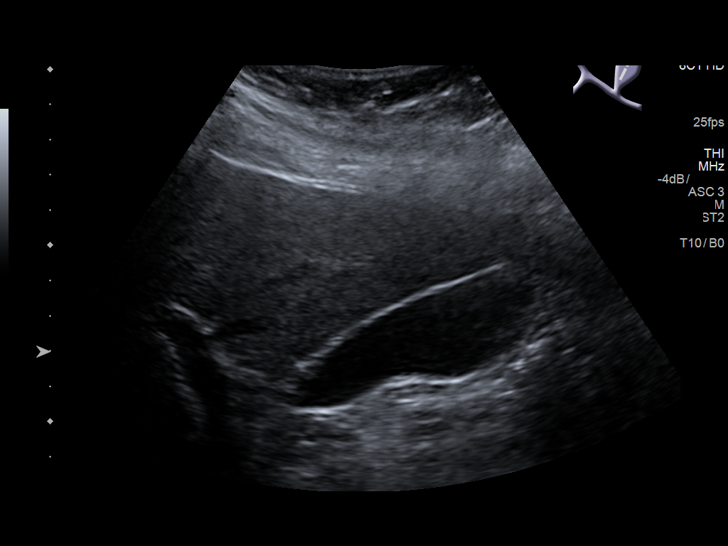
[im 9/99]
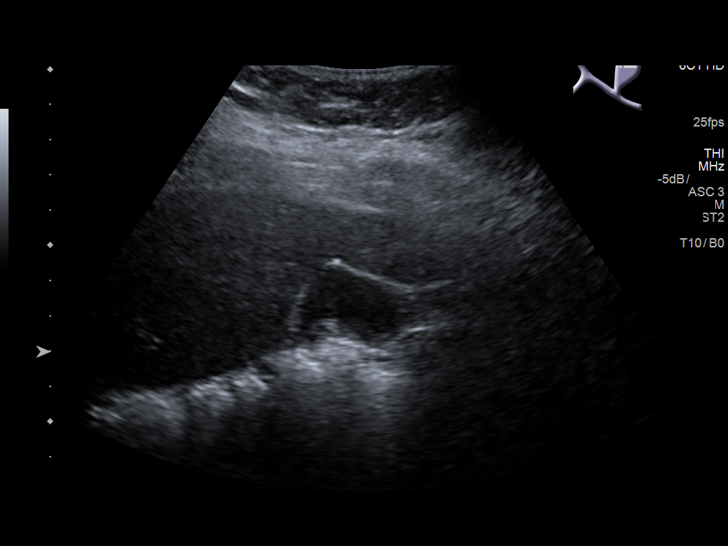
[im 17/99]
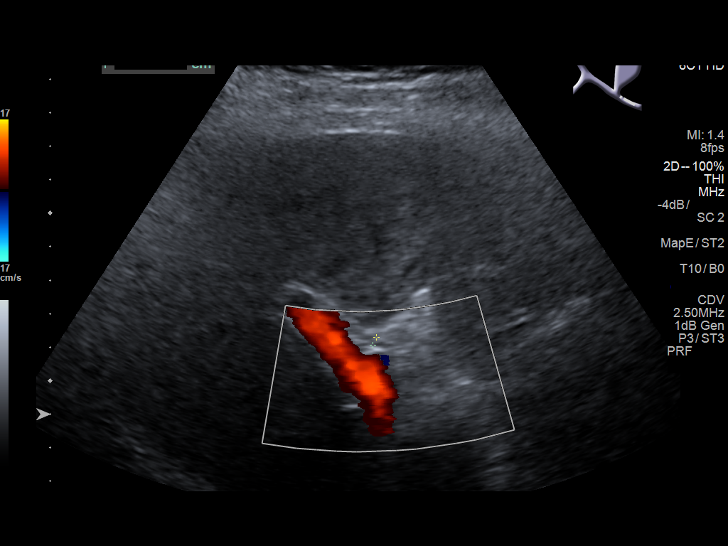
[im 25/99]
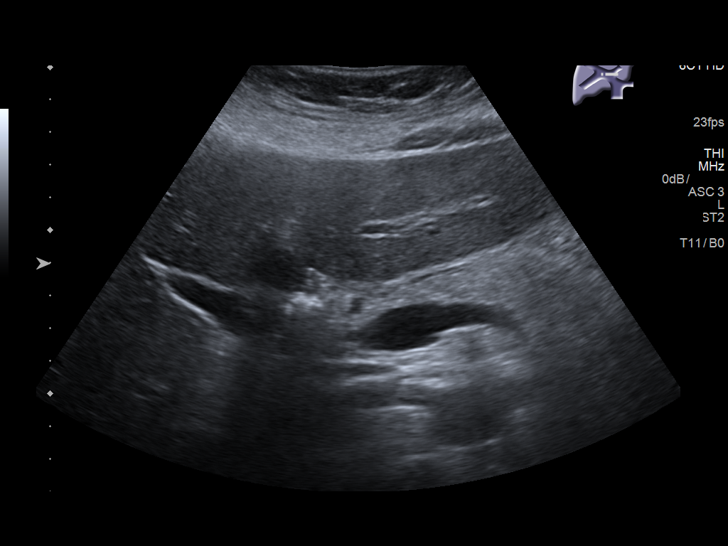
[im 33/99]
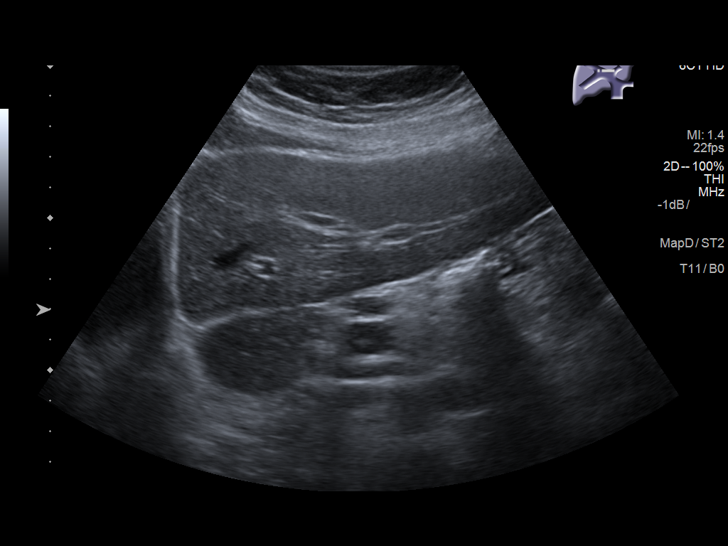
[im 37/99]
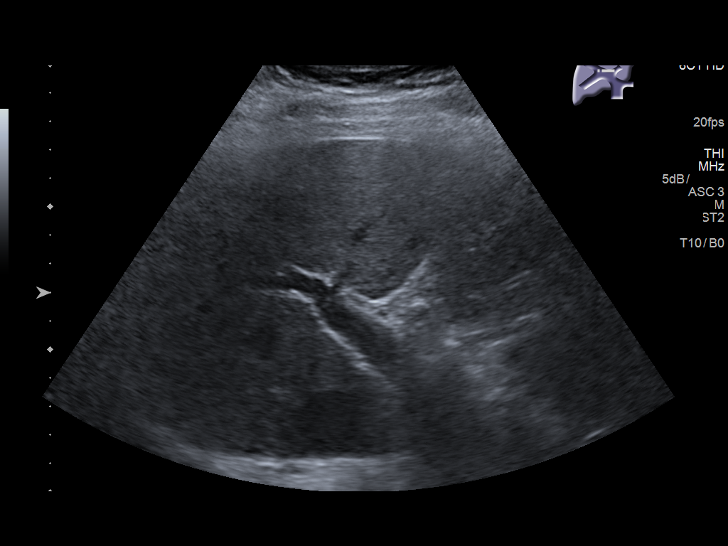
[im 45/99]
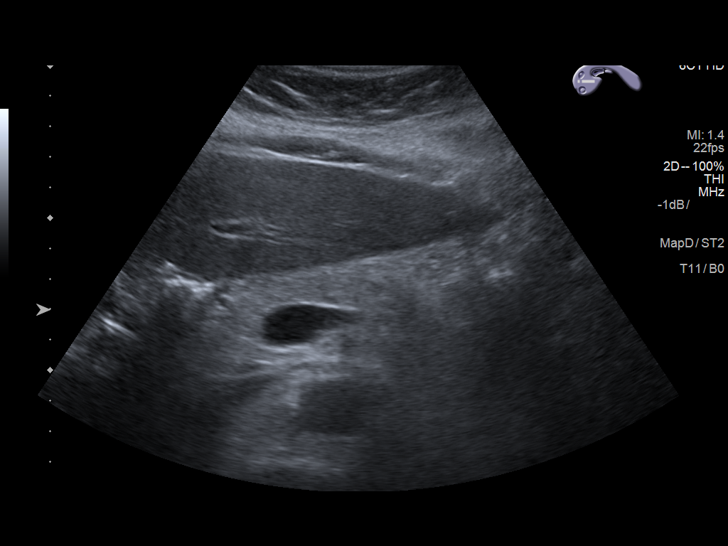
[im 54/99]
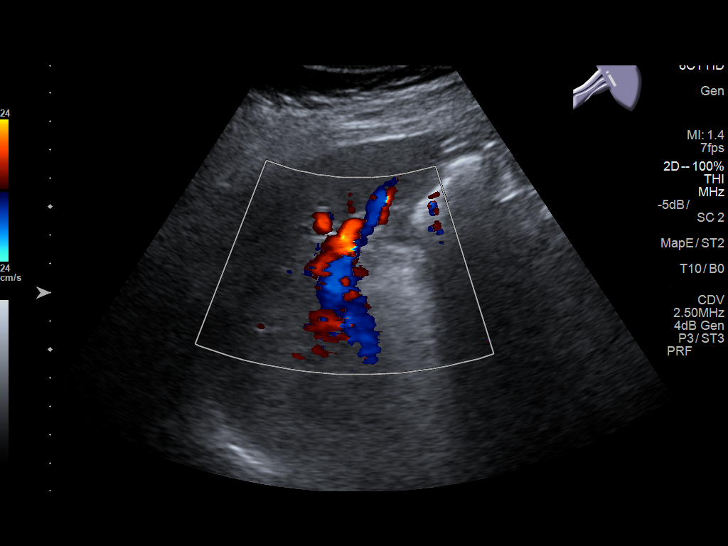
[im 62/99]
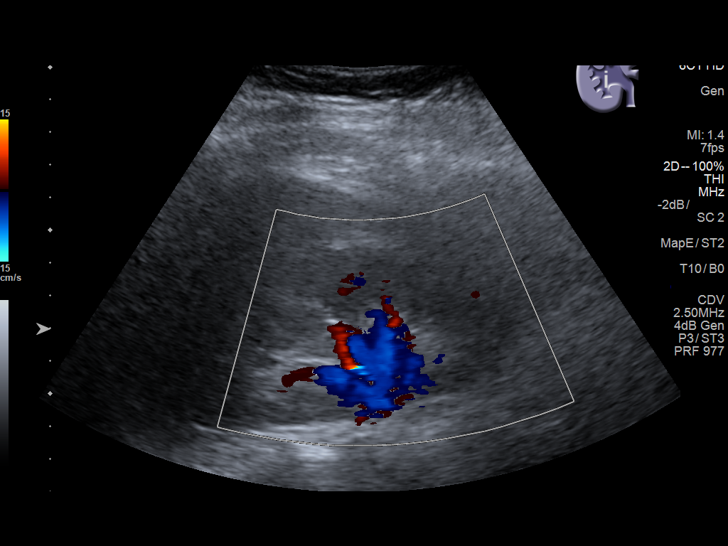
[im 66/99]
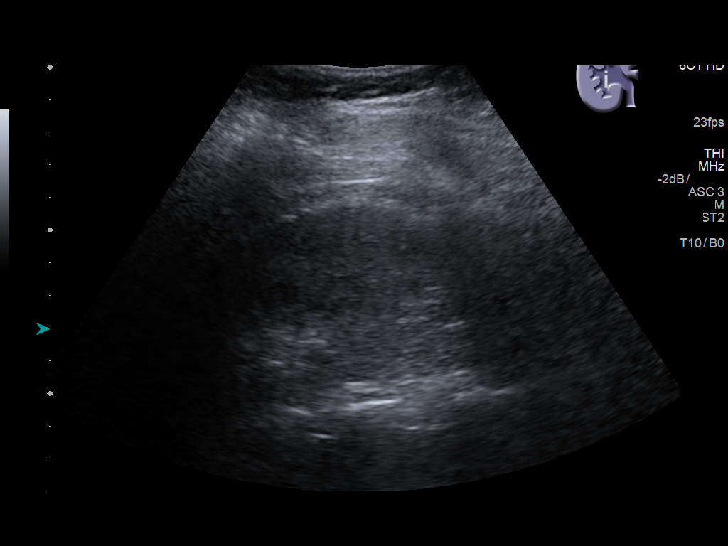
[im 74/99]
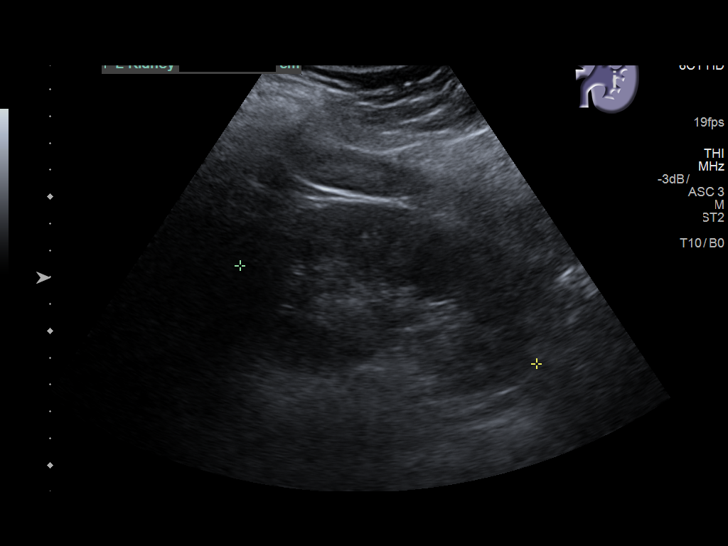
[im 82/99]
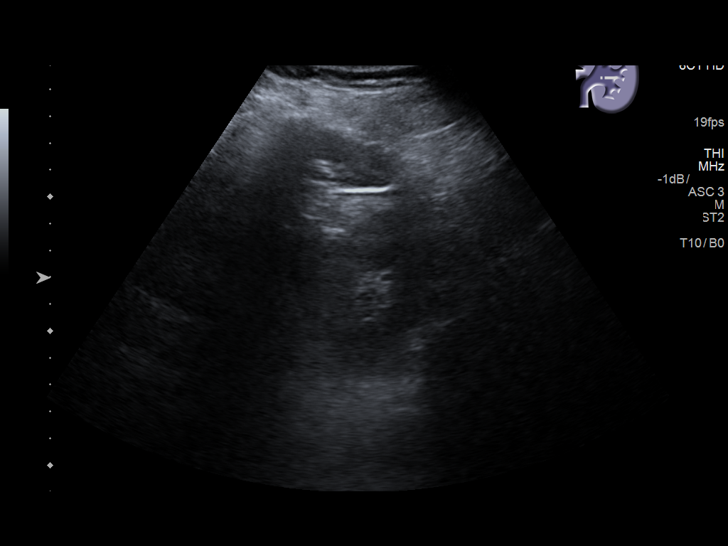
[im 90/99]
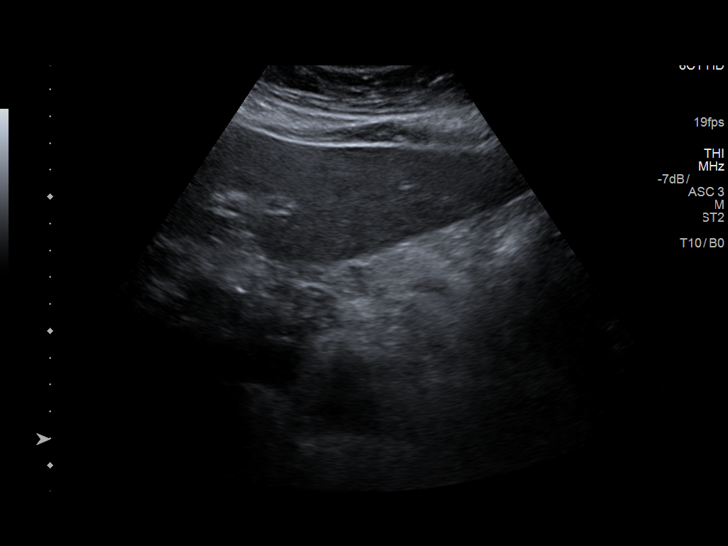
[im 99/99]
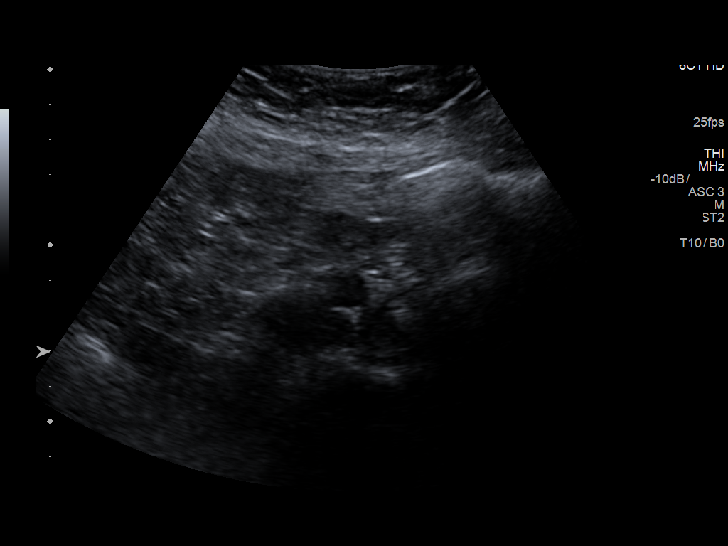

[14 of 25 positions shown; findings below may reference images not displayed]

FINDINGS: Gallbladder: No gallstones or wall thickening visualized. No
sonographic Murphy sign noted by sonographer.

Common bile duct: Diameter: 2.6 mm

Liver: Increased in echogenicity consistent with fatty infiltration.

IVC: No abnormality visualized.

Pancreas: Visualized portion unremarkable.

Spleen: Size and appearance within normal limits.

Right Kidney: Length: 10.2 cm.. Echogenicity within normal limits.
No mass or hydronephrosis visualized.

Left Kidney: Length: 11.6 cm. Echogenicity within normal limits. No
mass or hydronephrosis visualized.

Abdominal aorta: No aneurysm visualized.

Other findings: None.
IMPRESSION: Fatty liver.

No other focal abnormality is seen.

## 2020-12-03 ENCOUNTER — Emergency Department (HOSPITAL_COMMUNITY)
Admission: EM | Admit: 2020-12-03 | Discharge: 2020-12-03 | Disposition: A | Discharge status: Home / Self Care | Payer: No Typology Code available for payment source | Attending: Emergency Medicine | Admitting: Emergency Medicine

## 2020-12-03 ENCOUNTER — Encounter (HOSPITAL_COMMUNITY): Payer: Self-pay | Admitting: Emergency Medicine

## 2020-12-03 ENCOUNTER — Other Ambulatory Visit: Payer: Self-pay

## 2020-12-03 DIAGNOSIS — R202 Paresthesia of skin: Secondary | ICD-10-CM | POA: Insufficient documentation

## 2020-12-03 DIAGNOSIS — R0789 Other chest pain: Secondary | ICD-10-CM | POA: Insufficient documentation

## 2020-12-03 DIAGNOSIS — J45909 Unspecified asthma, uncomplicated: Secondary | ICD-10-CM | POA: Insufficient documentation

## 2020-12-03 DIAGNOSIS — M79602 Pain in left arm: Secondary | ICD-10-CM | POA: Insufficient documentation

## 2020-12-03 LAB — CBC
HCT: 38.3 % (ref 36.0–46.0)
Hemoglobin: 13.2 g/dL (ref 12.0–15.0)
MCH: 31.8 pg (ref 26.0–34.0)
MCHC: 34.5 g/dL (ref 30.0–36.0)
MCV: 92.3 fL (ref 80.0–100.0)
Platelets: 142 10*3/uL — ABNORMAL LOW (ref 150–400)
RBC: 4.15 MIL/uL (ref 3.87–5.11)
RDW: 11.9 % (ref 11.5–15.5)
WBC: 4.5 10*3/uL (ref 4.0–10.5)
nRBC: 0 % (ref 0.0–0.2)

## 2020-12-03 LAB — BASIC METABOLIC PANEL
Anion gap: 8 (ref 5–15)
BUN: 14 mg/dL (ref 6–20)
CO2: 23 mmol/L (ref 22–32)
Calcium: 8.9 mg/dL (ref 8.9–10.3)
Chloride: 105 mmol/L (ref 98–111)
Creatinine, Ser: 0.94 mg/dL (ref 0.44–1.00)
GFR, Estimated: >60 mL/min (ref >60)
Glucose, Bld: 110 mg/dL — ABNORMAL HIGH (ref 70–99)
Potassium: 3.7 mmol/L (ref 3.5–5.1)
Sodium: 136 mmol/L (ref 135–145)

## 2020-12-03 LAB — TROPONIN I (HIGH SENSITIVITY): Troponin I (High Sensitivity): 4 ng/L (ref <18)

## 2020-12-03 MED ORDER — IBUPROFEN 800 MG PO TABS
800.0000 mg | ORAL_TABLET | Freq: Once | ORAL | Status: AC
  Administered 2020-12-03: 19:00:00 800 mg via ORAL
  Filled 2020-12-03: qty 1

## 2020-12-03 MED ORDER — ACETAMINOPHEN 500 MG PO TABS
1000.0000 mg | ORAL_TABLET | Freq: Once | ORAL | Status: AC
  Administered 2020-12-03: 19:00:00 1000 mg via ORAL
  Filled 2020-12-03: qty 2

## 2020-12-03 NOTE — ED Triage Notes (Signed)
Pt arrives to ED with c/o substernal chest pain that radiates down left arm. The pain has been there x1 week. The pain is intermittent but comes daily. Pt denies nausea and SOB.

## 2020-12-03 NOTE — ED Notes (Signed)
Discharge instructions discussed with pt. Pt verbalized understanding. Pt stable and ambulatory. No signature pad available. 

## 2020-12-03 NOTE — Discharge Instructions (Signed)
Take 4 over the counter ibuprofen tablets 3 times a day or 2 over-the-counter naproxen tablets twice a day for pain. Also take tylenol 1000mg(2 extra strength) four times a day.    

## 2020-12-03 NOTE — ED Provider Notes (Signed)
[place] EMERGENCY DEPARTMENT Provider Note   CSN: [id number] Arrival date & time: 12/03/20  1707     History Chief Complaint  Patient presents with  . Chest Pain    Melanie Durham is a 59 y.o. female.  59 yo F with a chief complaint of chest pain.  Is left-sided and radiates down the left arm.  Going on for the past couple months.  Worse with certain movements and positions.  She has felt some numbness off and on to the right side of her back as well as the left arm.  She had pain worse today when she was trying to prepare for Christmas tomorrow.  She talked to her son who suggested she come to the hospital for evaluation.  Denies overt trauma denies cough congestion or fever.  Denies history of MI.  Denies history of PE or DVT denies hemoptysis denies unilateral lower extremity edema denies recent surgery hospitalization or immobilization.  Denies estrogen use.  No history of cancer.  The history is provided by the patient.  Chest Pain Pain location:  L lateral chest Pain quality: sharp   Pain radiates to:  L arm Pain severity:  Moderate Onset quality:  Gradual Duration:  2 months Timing:  Intermittent Progression:  Waxing and waning Chronicity:  New Relieved by:  Nothing Worsened by:  Certain positions and movement Ineffective treatments:  None tried Associated symptoms: numbness   Associated symptoms: no dizziness, no fever, no headache, no nausea, no palpitations, no shortness of breath and no vomiting        Past Medical History:  Diagnosis Date  . Allergy   . Asthma started in childhood   URIs are triggers  . Bronchitis   . Fatty liver 2016  . Pelvic varices 2016   Left pelvic--asymptomatic--on CT Scan    Patient Active Problem List   Diagnosis Date Noted  . Environmental and seasonal allergies 11/16/2017  . Fatty liver 05/03/2017  . Abdominal pain, epigastric 06/17/2014  . Gastroesophageal reflux disease without esophagitis  06/17/2014  . Nevus 05/18/2014    History reviewed. No pertinent surgical history.   OB History   No obstetric history on file.     Family History  Problem Relation Age of Onset  . Ovarian cysts Mother   . Diabetes Sister   . Pancreatitis Brother        unknown etiology  . Breast cancer Neg Hx     Social History   Tobacco Use  . Smoking status: Never Smoker  . Smokeless tobacco: Never Used  Vaping Use  . Vaping Use: Never used  Substance Use Topics  . Alcohol use: No    Alcohol/week: 0.0 standard drinks  . Drug use: No    Home Medications Prior to Admission medications   Medication Sig Start Date End Date Taking? Authorizing Provider  gabapentin (NEURONTIN) 100 MG capsule 3 caps by mouth at bedtime daily. 08/06/17   Mack Hook, MD  loratadine (CLARITIN) 10 MG tablet Take 1 tablet (10 mg total) by mouth daily. 08/05/18   Mack Hook, MD  mometasone (NASONEX) 50 MCG/ACT nasal spray 2 sprays each nostril daily 08/05/18   Mack Hook, MD  omeprazole (PRILOSEC) 20 MG capsule Take 1 capsule (20 mg total) by mouth daily. 06/05/18   Mack Hook, MD  triamcinolone cream (KENALOG) 0.1 % Apply 1 application topically 2 (two) times daily. 04/09/17   Mack Hook, MD    Allergies    Penicillins  Review of  Systems   Review of Systems  Constitutional: Negative for chills and fever.  HENT: Negative for congestion and rhinorrhea.   Eyes: Negative for redness and visual disturbance.  Respiratory: Negative for shortness of breath and wheezing.   Cardiovascular: Positive for chest pain. Negative for palpitations.  Gastrointestinal: Negative for nausea and vomiting.  Genitourinary: Negative for dysuria and urgency.  Musculoskeletal: Positive for arthralgias and myalgias.  Skin: Negative for pallor and wound.  Neurological: Positive for numbness. Negative for dizziness and headaches.    Physical Exam Updated Vital Signs BP (!) 146/100    Pulse 67   Temp 98.1 F (36.7 C) (Oral)   Resp 16   SpO2 99%   Physical Exam Vitals and nursing note reviewed.  Constitutional:      General: She is not in acute distress.    Appearance: She is well-developed and well-nourished. She is not diaphoretic.  HENT:     Head: Normocephalic and atraumatic.  Eyes:     Extraocular Movements: EOM normal.     Pupils: Pupils are equal, round, and reactive to light.  Cardiovascular:     Rate and Rhythm: Normal rate and regular rhythm.     Heart sounds: No murmur heard. No friction rub. No gallop.   Pulmonary:     Effort: Pulmonary effort is normal.     Breath sounds: No wheezing or rales.  Chest:     Chest wall: Tenderness present.     Comments: Tenderness about the left chest wall along the lateral clavicular line about ribs 4 through 6 reproduces her pain.  Mild pain along the trapezius as well.  Pulse motor and sensation intact to left upper extremity.  Full range of motion of the shoulder.  No appreciable lower extremity edema. Abdominal:     General: There is no distension.     Palpations: Abdomen is soft.     Tenderness: There is no abdominal tenderness.  Musculoskeletal:        General: No tenderness or edema.     Cervical back: Normal range of motion and neck supple.  Skin:    General: Skin is warm and dry.  Neurological:     Mental Status: She is alert and oriented to person, place, and time.  Psychiatric:        Mood and Affect: Mood and affect normal.        Behavior: Behavior normal.     ED Results / Procedures / Treatments   Labs (all labs ordered are listed, but only abnormal results are displayed) Labs Reviewed  BASIC METABOLIC PANEL - Abnormal; Notable for the following components:      Result Value   Glucose, Bld 110 (*)    All other components within normal limits  CBC - Abnormal; Notable for the following components:   Platelets 142 (*)    All other components within normal limits  TROPONIN I (HIGH  SENSITIVITY)  TROPONIN I (HIGH SENSITIVITY)    EKG EKG Interpretation  Date/Time:  Friday December 03 2020 17:09:00 EST Ventricular Rate:  64 PR Interval:  138 QRS Duration: 72 QT Interval:  418 QTC Calculation: 431 R Axis:   69 Text Interpretation: Normal sinus rhythm Cannot rule out Anterior infarct , age undetermined Abnormal ECG No significant change since last tracing Confirmed by Deno Etienne [id number]) on 12/03/2020 6:25:44 PM   Radiology No results found.  Procedures Procedures (including critical care time)  Medications Ordered in ED Medications  acetaminophen (TYLENOL) tablet 1,000 mg (1,000  mg Oral Given 12/03/20 1854)  ibuprofen (ADVIL) tablet 800 mg (800 mg Oral Given 12/03/20 1854)    ED Course  I have reviewed the triage vital signs and the nursing notes.  Pertinent labs & imaging results that were available during my care of the patient were reviewed by me and considered in my medical decision making (see chart for details).    MDM Rules/Calculators/A&P                          59 yo F with a chief complaint of left-sided chest pain.  Going on for the past couple months.  Reproduced on exam.  Initial troponin is negative.  EKG without ischemic findings.  Chest x-ray viewed by me without focal infiltrate or pneumothorax.  With her prolonged history of symptoms and it being reproduced on exam and no significant change in the past 6 hours do not feel that a repeat troponin would be of benefit.  History and exam completely atypical of pulmonary embolism.  Will discharge the patient home.  PCP follow-up.  6:57 PM:  I have discussed the diagnosis/risks/treatment options with the patient and believe the pt to be eligible for discharge home to follow-up with PCP. We also discussed returning to the ED immediately if new or worsening sx occur. We discussed the sx which are most concerning (e.g., sudden worsening pain, fever, inability to tolerate by mouth) that necessitate  immediate return. Medications administered to the patient during their visit and any new prescriptions provided to the patient are listed below.  Medications given during this visit Medications  acetaminophen (TYLENOL) tablet 1,000 mg (1,000 mg Oral Given 12/03/20 1854)  ibuprofen (ADVIL) tablet 800 mg (800 mg Oral Given 12/03/20 1854)     The patient appears reasonably screen and/or stabilized for discharge and I doubt any other medical condition or other [facility] requiring further screening, evaluation, or treatment in the ED at this time prior to discharge.     Final Clinical Impression(s) / ED Diagnoses Final diagnoses:  Atypical chest pain    Rx / DC Orders ED Discharge Orders    None       Deno Etienne, DO 12/03/20 1857

## 2024-07-13 ENCOUNTER — Other Ambulatory Visit: Payer: Self-pay

## 2024-07-13 ENCOUNTER — Emergency Department (HOSPITAL_COMMUNITY)

## 2024-07-13 ENCOUNTER — Encounter (HOSPITAL_COMMUNITY): Payer: Self-pay | Admitting: Pharmacy Technician

## 2024-07-13 ENCOUNTER — Emergency Department (HOSPITAL_COMMUNITY)
Admission: EM | Admit: 2024-07-13 | Discharge: 2024-07-13 | Disposition: A | Discharge status: Home / Self Care | Attending: Emergency Medicine | Admitting: Emergency Medicine

## 2024-07-13 DIAGNOSIS — M546 Pain in thoracic spine: Secondary | ICD-10-CM | POA: Insufficient documentation

## 2024-07-13 DIAGNOSIS — J45909 Unspecified asthma, uncomplicated: Secondary | ICD-10-CM | POA: Insufficient documentation

## 2024-07-13 LAB — URINALYSIS, ROUTINE W REFLEX MICROSCOPIC
Bilirubin Urine: NEGATIVE
Glucose, UA: NEGATIVE mg/dL
Hgb urine dipstick: NEGATIVE
Ketones, ur: NEGATIVE mg/dL
Leukocytes,Ua: NEGATIVE
Nitrite: NEGATIVE
Protein, ur: NEGATIVE mg/dL
Specific Gravity, Urine: 1.012 (ref 1.005–1.030)
pH: 7 (ref 5.0–8.0)

## 2024-07-13 LAB — CBC
HCT: 38.9 % (ref 36.0–46.0)
Hemoglobin: 13.1 g/dL (ref 12.0–15.0)
MCH: 31.3 pg (ref 26.0–34.0)
MCHC: 33.7 g/dL (ref 30.0–36.0)
MCV: 93.1 fL (ref 80.0–100.0)
Platelets: 135 K/uL — ABNORMAL LOW (ref 150–400)
RBC: 4.18 MIL/uL (ref 3.87–5.11)
RDW: 11.9 % (ref 11.5–15.5)
WBC: 4.3 K/uL (ref 4.0–10.5)
nRBC: 0 % (ref 0.0–0.2)

## 2024-07-13 LAB — COMPREHENSIVE METABOLIC PANEL WITH GFR
ALT: 34 U/L (ref 0–44)
AST: 26 U/L (ref 15–41)
Albumin: 3.8 g/dL (ref 3.5–5.0)
Alkaline Phosphatase: 67 U/L (ref 38–126)
Anion gap: 6 (ref 5–15)
BUN: 12 mg/dL (ref 8–23)
CO2: 24 mmol/L (ref 22–32)
Calcium: 9 mg/dL (ref 8.9–10.3)
Chloride: 107 mmol/L (ref 98–111)
Creatinine, Ser: 0.62 mg/dL (ref 0.44–1.00)
GFR, Estimated: >60 mL/min (ref >60)
Glucose, Bld: 80 mg/dL (ref 70–99)
Potassium: 3.8 mmol/L (ref 3.5–5.1)
Sodium: 137 mmol/L (ref 135–145)
Total Bilirubin: 0.5 mg/dL (ref 0.0–1.2)
Total Protein: 7.5 g/dL (ref 6.5–8.1)

## 2024-07-13 LAB — LIPASE, BLOOD: Lipase: 45 U/L (ref 11–51)

## 2024-07-13 MED ORDER — LIDOCAINE 5 % EX PTCH
1.0000 | MEDICATED_PATCH | CUTANEOUS | 0 refills | Status: AC
  Filled 2024-07-13: qty 30, 30d supply, fill #0

## 2024-07-13 MED ORDER — METHOCARBAMOL 500 MG PO TABS
500.0000 mg | ORAL_TABLET | Freq: Once | ORAL | Status: AC
  Administered 2024-07-13: 500 mg via ORAL
  Filled 2024-07-13: qty 1

## 2024-07-13 MED ORDER — KETOROLAC TROMETHAMINE 60 MG/2ML IM SOLN
15.0000 mg | Freq: Once | INTRAMUSCULAR | Status: AC
  Administered 2024-07-13: 15 mg via INTRAMUSCULAR
  Filled 2024-07-13: qty 2

## 2024-07-13 MED ORDER — LIDOCAINE 5 % EX PTCH
1.0000 | MEDICATED_PATCH | CUTANEOUS | Status: DC
  Administered 2024-07-13: 1 via TRANSDERMAL
  Filled 2024-07-13: qty 1

## 2024-07-13 MED ORDER — OXYCODONE HCL 5 MG PO TABS
5.0000 mg | ORAL_TABLET | Freq: Once | ORAL | Status: AC
  Administered 2024-07-13: 5 mg via ORAL
  Filled 2024-07-13: qty 1

## 2024-07-13 MED ORDER — ACETAMINOPHEN 500 MG PO TABS
1000.0000 mg | ORAL_TABLET | Freq: Once | ORAL | Status: AC
  Administered 2024-07-13: 1000 mg via ORAL
  Filled 2024-07-13: qty 2

## 2024-07-13 MED ORDER — METHOCARBAMOL 500 MG PO TABS
500.0000 mg | ORAL_TABLET | Freq: Two times a day (BID) | ORAL | 0 refills | Status: AC
  Filled 2024-07-13: qty 20, 10d supply, fill #0

## 2024-07-13 NOTE — ED Triage Notes (Signed)
 Pt here via POV. Per interpreter: Pt with back pain, recently with dizziness and nausea. Has a stabbing sensation in back.

## 2024-07-13 NOTE — ED Provider Notes (Signed)
 [place] EMERGENCY DEPARTMENT AT [facility] Provider Note   CSN: [id number] Arrival date & time: 07/13/24  1752     History {Add pertinent medical, surgical, social history, OB history to HPI:1} No chief complaint on file.   Melanie Durham is a 63 y.o. female with PMH as listed below who presents with Acute on chronic upper back pain.  Patient states that she has bilateral and midline thoracic back pain that has been going on for years but has been worse over the last 4 days.  No inciting trauma that she is aware of.  Denies any fevers but endorses chills.  Denies urinary symptoms, new trauma, numbness tingling in her bilateral lower extremities, saddle anesthesia, loss of bladder or bowel, chest pain, abdominal pain, cough.  Does endorse nausea and feeling unwell and tired recently.  Severe pain such as she cannot lie down and has not been able to rest. No h/o cancer or IVDU.    Past Medical History:  Diagnosis Date   Allergy    Asthma started in childhood   URIs are triggers   Bronchitis    Fatty liver 2016   Pelvic varices 2016   Left pelvic--asymptomatic--on CT Scan       Home Medications Prior to Admission medications   Medication Sig Start Date End Date Taking? Authorizing Provider  gabapentin  (NEURONTIN ) 100 MG capsule 3 caps by mouth at bedtime daily. 08/06/17   Adella Norris, MD  loratadine  (CLARITIN ) 10 MG tablet Take 1 tablet (10 mg total) by mouth daily. 08/05/18   Adella Norris, MD  mometasone  (NASONEX ) 50 MCG/ACT nasal spray 2 sprays each nostril daily 08/05/18   Adella Norris, MD  omeprazole  (PRILOSEC) 20 MG capsule Take 1 capsule (20 mg total) by mouth daily. 06/05/18   Adella Norris, MD  triamcinolone  cream (KENALOG ) 0.1 % Apply 1 application topically 2 (two) times daily. 04/09/17   Adella Norris, MD      Allergies    Penicillins    Review of Systems   Review of Systems A 10 point review of systems was performed  and is negative unless otherwise reported in HPI.  Physical Exam Updated Vital Signs BP (!) 156/84 (BP Location: Right Arm)   Pulse (!) 58   Temp 98.8 F (37.1 C)   Resp 18   SpO2 98%  Physical Exam General: Normal appearing female, lying in bed.  HEENT: PERRLA, Sclera anicteric, MMM, trachea midline.  Cardiology: RRR, no murmurs/rubs/gallops.  Resp: Normal respiratory rate and effort. CTAB, no wheezes, rhonchi, crackles.  Abd: Soft, non-tender, non-distended. No rebound tenderness or guarding.  GU: Deferred. MSK: No peripheral edema or signs of trauma. Extremities without deformity or TTP.  Skin: warm, dry.  Back: Positive midline thoracic tenderness with no step-offs or deformities.  Positive bilateral paraspinal muscular tender palpation in thoracic region as well.  No gross signs of trauma. Neuro: A&Ox4, CNs II-XII grossly intact. 5/5 strength in BL LEs. Sensation grossly intact. Those listed above Psych: Normal mood and affect.   ED Results / Procedures / Treatments   Labs (all labs ordered are listed, but only abnormal results are displayed) Labs Reviewed  CBC - Abnormal; Notable for the following components:      Result Value   Platelets 135 (*)    All other components within normal limits  LIPASE, BLOOD  COMPREHENSIVE METABOLIC PANEL WITH GFR  URINALYSIS, ROUTINE W REFLEX MICROSCOPIC    EKG None  Radiology No results found.  Procedures Procedures  {  Document cardiac monitor, telemetry assessment procedure when appropriate:1}  Medications Ordered in ED Medications  acetaminophen  (TYLENOL ) tablet 1,000 mg (has no administration in time range)  ketorolac  (TORADOL ) injection 15 mg (has no administration in time range)  methocarbamol  (ROBAXIN ) tablet 500 mg (has no administration in time range)  lidocaine  (LIDODERM ) 5 % 1 patch (has no administration in time range)  oxyCODONE  (Oxy IR/ROXICODONE ) immediate release tablet 5 mg (has no administration in time range)     ED Course/ Medical Decision Making/ A&P                          Medical Decision Making Amount and/or Complexity of Data Reviewed Labs: ordered. Radiology: ordered.  Risk OTC drugs. Prescription drug management.    This patient presents to the ED for concern of ***, this involves an extensive number of treatment options, and is a complaint that carries with it a high risk of complications and morbidity.  I considered the following differential and admission for this acute, potentially life threatening condition.   MDM:    DDX for low back pain includes but is not limited to:   Consider MSK pain as presenting etiology and back strain or sciatica. Less likely sciatica as straight leg raise test was negative. No back pain red flags on history or physical. Presentation not consistent with malignancy (lack of history of malignancy, lack of B symptoms), fracture (no trauma, no bony tenderness to palpation), cauda equina (no bowel or urinary incontinence/retention, no saddle anesthesia, no distal weakness), AAA, viscus perforation, osteomyelitis or epidural abscess (no IVDU, vertebral tenderness), renal colic, pyelonephritis (afebrile, no CVAT, no urinary symptoms). Given the clinical picture, no indication for imaging at this time.      Labs: I Ordered, and personally interpreted labs.  The pertinent results include:  ***  Imaging Studies ordered: I ordered imaging studies including CT thoracic spine I independently visualized and interpreted imaging. I agree with the radiologist interpretation  Additional history obtained from chart review.    Reevaluation: After the interventions noted above, I reevaluated the patient and found that they have :{resolved/improved/worsened:23923::improved}  Social Determinants of Health: Lives independently  Disposition:  ***  Co morbidities that complicate the patient evaluation  Past Medical History:  Diagnosis Date   Allergy     Asthma started in childhood   URIs are triggers   Bronchitis    Fatty liver 2016   Pelvic varices 2016   Left pelvic--asymptomatic--on CT Scan     Medicines Meds ordered this encounter  Medications   acetaminophen  (TYLENOL ) tablet 1,000 mg   ketorolac  (TORADOL ) injection 15 mg   methocarbamol  (ROBAXIN ) tablet 500 mg   lidocaine  (LIDODERM ) 5 % 1 patch   oxyCODONE  (Oxy IR/ROXICODONE ) immediate release tablet 5 mg    Refill:  0    I have reviewed the patients home medicines and have made adjustments as needed  Problem List / ED Course: Problem List Items Addressed This Visit   None        {Document critical care time when appropriate:1} {Document review of labs and clinical decision tools ie heart score, Chads2Vasc2 etc:1}  {Document your independent review of radiology images, and any outside records:1} {Document your discussion with family members, caretakers, and with consultants:1} {Document social determinants of health affecting pt's care:1} {Document your decision making why or why not admission, treatments were needed:1}  This note was created using dictation software, which may contain spelling or grammatical errors.

## 2024-07-13 NOTE — Discharge Instructions (Addendum)
 Thank you for coming to [facility] Emergency Department. You were seen for back pain. We did an exam, labs, and imaging, and these showed no acute findings. The CT scan did show some small nodules on the spine that are typically benign and represent small bulges of discs that do not typically cause pain.  You can alternate taking Tylenol  and ibuprofen  as needed for pain. You can take 650mg  tylenol  (acetaminophen ) every 4-6 hours, and 600 mg ibuprofen  3 times a day. You can take robaxin  500 mg twice per day for muscle relaxer. Lidocaine  patches once per day can also help. Massage, stretching, and heat can also help.   Please follow up with your primary care provider within 1 week. You can also make an appointment with a spine doctor by calling [phone].  Return to the ED if you develop any of the following: - Fever (100.4 F or 38 C) or chills at home that do not respond to over the counter medications - Weakness, numbness, or tingling in your extremities - Difficulty emptying bladder / urinary incontinence - Fecal incontinence - Uncontrolled nausea/vomiting with inability to keep down liquids - Feeling as though you are going to pass out or passing out - Anything else that concerns you   Gracias por venir al [employer] [facility] [place]. Lo atendieron por dolor de espalda. Le hicimos un examen, anlisis de laboratorio e imgenes, y no se observaron hallazgos agudos. La tomografa computarizada mostr algunos pequeos ndulos en la columna vertebral que suelen ser benignos y representan pequeas protuberancias discales que no suelen Programmer, multimedia.  Puede alternar la toma de Tylenol  e ibuprofeno segn sea necesario para el dolor. Puede tomar 650 mg de Tylenol  (paracetamol) cada 4 a 6 horas y 600 mg de ibuprofeno 3 veces al da. Puede tomar Robaxin  500 mg dos veces al da como relajante muscular. Los parches de lidocana una vez al da tambin pueden ayudar. [place], los estiramientos  y el calor tambin pueden ayudar.  Por favor, consulte con su mdico de cabecera dentro de una semana. Tambin puede programar una cita con un especialista en columna llamando al [phone].  Regrese a urgencias si presenta alguno de los siguientes sntomas: - Fiebre (38 C o 100.4 F) o escalofros en casa que no responden a medicamentos de venta libre - Debilidad, entumecimiento u hormigueo en las extremidades - Dificultad para vaciar la vejiga / incontinencia urinaria - Incontinencia fecal - Nuseas/vmitos incontrolables con incapacidad para retener lquidos - Sensacin de que se va a Artist o desmayo - Cualquier otra cosa que le preocupe

## 2024-07-14 ENCOUNTER — Other Ambulatory Visit: Payer: Self-pay

## 2024-08-19 ENCOUNTER — Telehealth (INDEPENDENT_AMBULATORY_CARE_PROVIDER_SITE_OTHER): Payer: Self-pay | Admitting: Primary Care

## 2024-08-19 NOTE — Telephone Encounter (Signed)
 Called pt to confirm appt. Pt did not answer and LVM

## 2024-08-20 ENCOUNTER — Encounter (INDEPENDENT_AMBULATORY_CARE_PROVIDER_SITE_OTHER): Payer: Self-pay | Admitting: Primary Care

## 2024-08-20 ENCOUNTER — Ambulatory Visit (INDEPENDENT_AMBULATORY_CARE_PROVIDER_SITE_OTHER): Payer: Self-pay | Admitting: Primary Care

## 2024-08-20 VITALS — BP 136/81 | HR 61 | Resp 14 | Ht 67.0 in | Wt 192.0 lb

## 2024-08-20 DIAGNOSIS — E66811 Obesity, class 1: Secondary | ICD-10-CM

## 2024-08-20 DIAGNOSIS — R5383 Other fatigue: Secondary | ICD-10-CM

## 2024-08-20 DIAGNOSIS — L659 Nonscarring hair loss, unspecified: Secondary | ICD-10-CM

## 2024-08-20 DIAGNOSIS — R635 Abnormal weight gain: Secondary | ICD-10-CM

## 2024-08-20 DIAGNOSIS — Z13220 Encounter for screening for lipoid disorders: Secondary | ICD-10-CM

## 2024-08-20 DIAGNOSIS — E559 Vitamin D deficiency, unspecified: Secondary | ICD-10-CM

## 2024-08-20 DIAGNOSIS — Z7689 Persons encountering health services in other specified circumstances: Secondary | ICD-10-CM

## 2024-08-20 DIAGNOSIS — K5900 Constipation, unspecified: Secondary | ICD-10-CM

## 2024-08-20 DIAGNOSIS — Z1231 Encounter for screening mammogram for malignant neoplasm of breast: Secondary | ICD-10-CM

## 2024-08-20 DIAGNOSIS — Z1211 Encounter for screening for malignant neoplasm of colon: Secondary | ICD-10-CM

## 2024-08-20 DIAGNOSIS — Z6830 Body mass index (BMI) 30.0-30.9, adult: Secondary | ICD-10-CM

## 2024-08-20 NOTE — Progress Notes (Signed)
 Pt is here to est care  Back pain, lower and upper per pt X3 weeks No injury to same per pt Pain is described as sharp, stinging pain

## 2024-08-20 NOTE — Progress Notes (Signed)
 New Patient Office Visit  Subjective    Patient ID: Melanie Durham female  DOB: 11/27/1961  Age: 63 y.o. MRN: [id number]   CC:  Melanie Durham is a 63 year old obese Hispanic female.(She has given her daughter permission Rawleigh to be present at her appointment and interpret for her and provide information when needed.  Back Pain This is a recurrent problem. The current episode started in the past 7 days. The problem occurs constantly. The problem has been waxing and waning since onset. The pain is present in the thoracic spine and lumbar spine. The quality of the pain is described as stabbing and aching. Radiates to: shoulder to chest left side. The pain is moderate. The pain is The same all the time. The symptoms are aggravated by sitting. Stiffness is present All day. Risk factors include menopause, obesity and lack of exercise. She has tried analgesics for the symptoms. The treatment provided significant relief.  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  Current Outpatient Medications on File Prior to Visit  Medication Sig Dispense Refill   gabapentin  (NEURONTIN ) 100 MG capsule 3 caps by mouth at bedtime daily. 90 capsule 11   lidocaine  (LIDODERM ) 5 % Place 1 patch onto the skin daily. Remove & Discard patch within 12 hours or as directed by MD 30 patch 0   loratadine  (CLARITIN ) 10 MG tablet Take 1 tablet (10 mg total) by mouth daily. 30 tablet 11   methocarbamol  (ROBAXIN ) 500 MG tablet Take 1 tablet (500 mg total) by mouth 2 (two) times daily. 20 tablet 0   mometasone  (NASONEX ) 50 MCG/ACT nasal spray 2 sprays each nostril daily 17 g 12   omeprazole  (PRILOSEC) 20 MG capsule Take 1 capsule (20 mg total) by mouth daily. 30 capsule 11   triamcinolone  cream (KENALOG ) 0.1 % Apply 1 application topically 2 (two) times daily. 80 g 1   No current facility-administered medications on file prior to visit.     Allergies   Allergen Reactions   Penicillins     Blurred vision and unable to hear--took once when a young girl    Past Medical History:  Diagnosis Date   Allergy    Asthma started in childhood   URIs are triggers   Bronchitis    Fatty liver 2016   Pelvic varices 2016   Left pelvic--asymptomatic--on CT Scan     No past surgical history on file.   Family History  Problem Relation Age of Onset   Ovarian cysts Mother    Diabetes Sister    Pancreatitis Brother        unknown etiology   Breast cancer Neg Hx     Social History   Socioeconomic History   Marital status: Married    Spouse name: Curlee Leoma Leos   Number of children: 9   Years of education: Not on file   Highest education level: Not on file  Occupational History   Occupation: Human resources officer  Tobacco Use   Smoking status: Never   Smokeless tobacco: Never  Vaping Use   Vaping status: Never Used  Substance and Sexual Activity   Alcohol use: No    Alcohol/week: 0.0 standard drinks of alcohol   Drug use: No   Sexual activity: Not on file  Other Topics Concern   Not on file  Social History Narrative   Originally from [place]   Came to [place]. In 2003   Looking for work.   Lives  at home with husband and 2 of her girls.   Social Drivers of Corporate investment banker Strain: Not on file  Food Insecurity: Not on file  Transportation Needs: Not on file  Physical Activity: Not on file  Stress: Not on file  Social Connections: Not on file  Intimate Partner Violence: Not on file       Health Maintenance  Topic Date Due   HIV Screening  Never done   Colon Cancer Screening  Never done   Pap with HPV screening  06/23/2019   Mammogram  08/29/2019   COVID-19 Vaccine (1 - 2024-25 season) Never done   Zoster (Shingles) Vaccine (1 of 2) 11/19/2024*   Flu Shot  03/10/2025*   Pneumococcal Vaccine for age over 69 (1 of 1 - PCV) 08/20/2025*   DTaP/Tdap/Td vaccine (2 - Td or Tdap) 10/21/2025   Hepatitis C Screening   Completed   Hepatitis B Vaccine  Aged Out   HPV Vaccine  Aged Out   Meningitis B Vaccine  Aged Out  *Topic was postponed. The date shown is not the original due date.    Objective    BP 136/81 (BP Location: Right Arm, Patient Position: Sitting, Cuff Size: Normal)   Pulse 61   Resp 14   Ht 5' 7 (1.702 m)   Wt 192 lb (87.1 kg)   SpO2 98%   BMI 30.07 kg/m  BP Readings from Last 3 Encounters:  08/20/24 136/81  07/13/24 136/85  12/03/20 (!) 146/100    Physical Exam Vitals reviewed.  Constitutional:      Appearance: Normal appearance. She is obese.  HENT:     Head: Normocephalic.     Right Ear: Tympanic membrane, ear canal and external ear normal.     Left Ear: Tympanic membrane, ear canal and external ear normal.     Nose: Nose normal.     Mouth/Throat:     Mouth: Mucous membranes are moist.  Eyes:     Extraocular Movements: Extraocular movements intact.     Pupils: Pupils are equal, round, and reactive to light.  Cardiovascular:     Rate and Rhythm: Normal rate and regular rhythm.  Pulmonary:     Effort: Pulmonary effort is normal.     Breath sounds: Normal breath sounds.  Abdominal:     General: Bowel sounds are normal.     Palpations: Abdomen is soft.  Musculoskeletal:        General: Normal range of motion.     Cervical back: Normal range of motion and neck supple.  Skin:    General: Skin is warm and dry.  Neurological:     Mental Status: She is alert and oriented to person, place, and time.  Psychiatric:        Mood and Affect: Mood normal.        Behavior: Behavior normal.        Thought Content: Thought content normal.      Assessment & Plan:  [name] was seen today for establish care.  Diagnoses and all orders for this visit:  Encounter to establish care  Encounter for screening mammogram for malignant neoplasm of breast -     MM 3D SCREENING MAMMOGRAM UNILATERAL RIGHT BREAST; Future  Colon cancer screening -     Ambulatory referral to  Gastroenterology  Class 1 obesity due to excess calories without serious comorbidity with body mass index (BMI) of 30.0 to 30.9 in adult  Vitamin D  deficiency -  VITAMIN D  25 Hydroxy (Vit-D Deficiency, Fractures); Future  Other fatigue 2/2 Weight gain -     TSH + free T4; Future  Constipation, unspecified constipation type 2/2 Hair loss -     TSH + free T4; Future   Lipid screening -     Lipid panel; Future     Follow-up:  Return for fasting labs. pap  The above assessment and management plan was discussed with the patient. The patient verbalized understanding of and has agreed to the management plan. Patient is aware to call the clinic if symptoms fail to improve or worsen. Patient is aware when to return to the clinic for a follow-up visit. Patient educated on when it is appropriate to go to the emergency department.   Rosaline Bohr, NP-C

## 2024-08-20 NOTE — Patient Instructions (Addendum)
 Obesity, Adult Obesity is having too much body fat. Being obese means that your weight is more than what is healthy for you.  BMI (body mass index) is a number that explains how much body fat you have. If you have a BMI of 30 or more, you are obese. Obesity can cause serious health problems, such as: Stroke. Coronary artery disease (CAD). Type 2 diabetes. Some types of cancer. High blood pressure (hypertension). High cholesterol. Gallbladder stones. Obesity can also contribute to: Osteoarthritis. Sleep apnea. Infertility problems. What are the causes? Eating meals each day that are high in calories, sugar, and fat. Drinking a lot of drinks that have sugar in them. Being born with genes that may make you more likely to become obese. Having a medical condition that causes obesity. Taking certain medicines. Sitting a lot (having a sedentary lifestyle). Not getting enough sleep. What increases the risk? Having a family history of obesity. Living in an area with limited access to: [place], recreation centers, or sidewalks. Healthy food choices, such as grocery stores and farmers' markets. What are the signs or symptoms? The main sign is having too much body fat. How is this treated? Treatment for this condition often includes changing your lifestyle. Treatment may include: Changing your diet. This may include making a healthy meal plan. Exercise. This may include activity that causes your heart to beat faster (aerobic exercise) and strength training. Work with your doctor to design a program that works for you. Medicine to help you lose weight. This may be used if you are not able to lose one pound a week after 6 weeks of healthy eating and more exercise. Treating conditions that cause the obesity. Surgery. Options may include gastric banding and gastric bypass. This may be done if: Other treatments have not helped to improve your condition. You have a BMI of 40 or higher. You have  life-threatening health problems related to obesity. Follow these instructions at home: Eating and drinking  Follow advice from your doctor about what to eat and drink. Your doctor may tell you to: Limit fast food, sweets, and processed snack foods. Choose low-fat options. For example, choose low-fat milk instead of whole milk. Eat five or more servings of fruits or vegetables each day. Eat at home more often. This gives you more control over what you eat. Choose healthy foods when you eat out. Learn to read food labels. This will help you learn how much food is in one serving. Keep low-fat snacks available. Avoid drinks that have a lot of sugar in them. These include soda, fruit juice, iced tea with sugar, and flavored milk. Drink enough water to keep your pee (urine) pale yellow. Do not go on fad diets. Physical activity Exercise often, as told by your doctor. Most adults should get up to 150 minutes of moderate-intensity exercise every week.Ask your doctor: What types of exercise are safe for you. How often you should exercise. Warm up and stretch before being active. Do slow stretching after being active (cool down). Rest between times of being active. Lifestyle Work with your doctor and a food expert (dietitian) to set a weight-loss goal that is best for you. Limit your screen time. Find ways to reward yourself that do not involve food. Do not drink alcohol if: Your doctor tells you not to drink. You are pregnant, may be pregnant, or are planning to become pregnant. If you drink alcohol: Limit how much you have to: 0-1 drink a day for women. 0-2 drinks  a day for men. Know how much alcohol is in your drink. In the [place] one drink equals one 12 oz bottle of beer (355 mL), one 5 oz glass of wine (148 mL), or one 1 oz glass of hard liquor (44 mL). General instructions Keep a weight-loss journal. This can help you keep track of: The food that you eat. How much exercise you  get. Take over-the-counter and prescription medicines only as told by your doctor. Take vitamins and supplements only as told by your doctor. Think about joining a support group. Pay attention to your mental health as obesity can lead to depression or self esteem issues. Keep all follow-up visits. Contact a doctor if: You cannot meet your weight-loss goal after you have changed your diet and lifestyle for 6 weeks. You are having trouble breathing. Summary Obesity is having too much body fat. Being obese means that your weight is more than what is healthy for you. Work with your doctor to set a weight-loss goal. Get regular exercise as told by your doctor. This information is not intended to replace advice given to you by your health care provider. Make sure you discuss any questions you have with your health care provider. Document Revised: 07/05/2021 Document Reviewed: 07/05/2021 Elsevier Patient Education  2024 Elsevier Inc.Dolor de espalda durante el embarazo Back Pain in Pregnancy El dolor de espalda es habitual durante el [place]. Puede deberse a varios factores relacionados con los cambios durante el [place], [place] otros: El crecimiento del beb y Careers information officer. Esto puede modificar el centro de gravedad y Radio producer que los msculos abdominales se debiliten. Las hormonas del Psychiatrist, que pueden hacer que los ligamentos de la pelvis se relajen. El aumento de peso durante el embarazo. Su postura o posicin. Siga estas instrucciones en su casa: Control del dolor y la rigidez     Si se lo indican, aplique hielo sobre la zona dolorida. Para hacer esto: Ponga el hielo en una bolsa plstica. Coloque una toalla entre la piel y la bolsa. Coloque el hielo durante 20 minutos, 2 a 3 veces al da. Retire el hielo si la piel se pone de color rojo brillante. Esto es [employer]. Si no puede sentir dolor, calor o fro, tiene un mayor riesgo de que se dae la zona. Si se lo indican, aplique calor  en la zona afectada antes de realizar ejercicios. Use la fuente de calor que el mdico le recomiende, como una compresa de calor hmedo o una almohadilla trmica. Coloque una toalla entre la piel y la fuente de calor. Aplique calor durante 20 a 30 minutos. Retire la fuente de calor si la piel se pone de color rojo brillante. Esto es especialmente importante si no puede sentir dolor, calor o fro. Puede correr un riesgo mayor de sufrir quemaduras. Si se lo indican, aplique un masaje en la zona afectada. Actividad Haga ejercicio como se lo haya indicado el mdico. Hacer actividad fsica suave es la mejor forma de evitar o controlar el dolor de espalda. Prstele atencin a su cuerpo cuando levante peso. Si le duele al levantar peso, pida ayuda. Pngase en cuclillas al levantar algo del suelo. No se agache. De esta forma usa  los msculos de las piernas en lugar de los de la espalda. Haga reposo en cama nicamente por perodos breves como se lo haya indicado el mdico. El reposo en cama solo debe hacerse cuando los episodios de dolor de espalda son ms intensos. Pararse, sentarse y acostarse No permanezca sentada o  de pie en el mismo lugar durante largos perodos. Cuando est sentada, adopte una Education officer, museum. Asegrese de que su cabeza descanse sobre sus hombros y no est colgando hacia delante. Use una almohada en la parte inferior de la espalda si es necesario. Trate de dormir de lado con una almohada de sostn para embarazadas o 1 o 2 almohadas comunes entre las piernas. Puede ser mejor dormir del lado izquierdo. Si tiene dolor de espalda despus de una noche de descanso, la cama puede ser demasiado blanda. Un colchn duro puede brindarle ms apoyo para la espalda durante el embarazo. Instrucciones generales Use los medicamentos de venta libre y los recetados solamente como se lo haya indicado el mdico. Use una faja de maternidad, un arns elstico o un cors para la espalda como se lo haya  indicado el mdico. Trabaje con un fisioterapeuta o un masajista para Engineer, manufacturing systems de Human resources officer de espalda. La acupuntura o la masoterapia pueden ser tiles. Siga una dieta saludable. Trate de aumentar de peso dentro de las recomendaciones del mdico. No use zapatos con tacones altos. Concurra a todas las visitas de seguimiento. Esto es importante. Comunquese con un mdico si: El dolor de Event organiser impide [employer] cotidianas. Siente dolor de un solo lado de la espalda. Aumenta el dolor en otras partes del cuerpo. Siente adormecimiento, hormigueo, debilidad o problemas con el uso de los brazos o las piernas. Siente adormecimiento que se extiende [employer] o ambas piernas. Tiene nuseas, vmitos o sudoracin. Solicite ayuda de inmediato si: Le aparecen algunos de los siguientes sntomas: Falta de aire, mareos o desmayos. Siente un dolor de espalda que no puede controlar con los [employer]. Cambios en el control intestinal o de la vejiga, u observa sangre en la orina. Siente un dolor de espalda que es rtmico y de tipo clico, similar a las contracciones del [place]. Las contracciones del parto suelen aparecer cada 2 minutos, tienen una duracin de aproximadamente 1 minuto, e implican una sensacin de empujar o presin en la pelvis. Tiene dolor de espalda y rompe la bolsa de aguas o tiene sangrado vaginal. El dolor aparece despus de una cada. Estos sntomas pueden representar un problema grave que constituye Radio broadcast assistant. No espere a ver si los sntomas desaparecen. Solicite atencin mdica de inmediato. Comunquese con el servicio de emergencias de su localidad (911 en los [place]). No conduzca por sus propios medios [employer]. Resumen El dolor de espalda puede deberse a varios factores relacionados con los cambios durante el [place]. Siga las instrucciones del mdico para Human resources officer de espalda. Use los medicamentos de venta libre y  los recetados solamente como se lo haya indicado el mdico. Haga ejercicio como se lo haya indicado el mdico. Hacer actividad fsica suave es la mejor forma de evitar o controlar el dolor de espalda. Concurra a todas las visitas de seguimiento. Esto es importante. Esta informacin no tiene Theme park manager el consejo del mdico. Asegrese de hacerle al mdico cualquier pregunta que tenga. Document Revised: 03/01/2021 Document Reviewed: 03/01/2021 Elsevier Patient Education  2024 [employer].

## 2024-09-08 ENCOUNTER — Telehealth (INDEPENDENT_AMBULATORY_CARE_PROVIDER_SITE_OTHER): Payer: Self-pay | Admitting: Primary Care

## 2024-09-08 NOTE — Telephone Encounter (Signed)
 Called pt to reschedule appt. Pt did not answer and LVM

## 2024-09-11 ENCOUNTER — Ambulatory Visit (INDEPENDENT_AMBULATORY_CARE_PROVIDER_SITE_OTHER): Admitting: Primary Care

## 2024-09-17 ENCOUNTER — Ambulatory Visit: Payer: Self-pay | Admitting: Internal Medicine
